# Patient Record
Sex: Male | Born: 1985 | Race: White | Hispanic: No | Marital: Married | Smoking: Never smoker
Health system: Southern US, Community
[De-identification: ages and names within clinical notes are randomized; demographics above are authoritative.]

## PROBLEM LIST (undated history)

## (undated) HISTORY — PX: TYMPANOSTOMY TUBE PLACEMENT: SHX32

## (undated) HISTORY — PX: MENISCUS DEBRIDEMENT: SHX5178

---
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
## 2011-03-10 DIAGNOSIS — J309 Allergic rhinitis, unspecified: Secondary | ICD-10-CM | POA: Insufficient documentation

## 2013-09-27 DIAGNOSIS — M545 Low back pain, unspecified: Secondary | ICD-10-CM | POA: Insufficient documentation

## 2014-03-14 DIAGNOSIS — M25461 Effusion, right knee: Secondary | ICD-10-CM | POA: Insufficient documentation

## 2017-06-26 ENCOUNTER — Ambulatory Visit: Payer: 59 | Admitting: Family Medicine

## 2017-06-26 ENCOUNTER — Encounter: Payer: Self-pay | Admitting: Family Medicine

## 2017-06-26 DIAGNOSIS — E669 Obesity, unspecified: Secondary | ICD-10-CM | POA: Insufficient documentation

## 2017-06-26 DIAGNOSIS — R21 Rash and other nonspecific skin eruption: Secondary | ICD-10-CM | POA: Diagnosis not present

## 2017-06-26 DIAGNOSIS — Z6841 Body Mass Index (BMI) 40.0 and over, adult: Secondary | ICD-10-CM

## 2017-06-26 MED ORDER — TRIAMCINOLONE ACETONIDE 0.5 % EX CREA: 1.0000 "application " | TOPICAL_CREAM | Freq: Two times a day (BID) | CUTANEOUS | 2 refills | Status: DC

## 2017-06-26 NOTE — Assessment & Plan Note (Signed)
Appears to be eczema, differential includes stasis dermatitis Has had good response to triamcinolone in the past, rx for this sent to pharmacy. He will contact me if not improving.

## 2017-06-26 NOTE — Progress Notes (Signed)
Patient ID: Russell Dorsey, male   DOB: 02/20/1985, 32 y.o.   MRN: [id number]  Russell Dorsey - 32 y.o. male MRN [id number]  Date of birth: Sep 19, 1985  Subjective Chief Complaint  Patient presents with  . Rash    HPI Russell Dorsey is a 32 y.o. male known to me from previous practice, here today with complaint of rash to posterior L leg.  Has had this in the past.  Rash is itchy in nature and tends to worsen in the summer.  Has used triamcinolone 0.5% cream in the past with good results.  He denies any pain associated with this, fever, chills, swelling or drainage.    -Obesity:  Has tried several diets in the past with mixed results.  Reports that weight often yo-yo's and that he doesn't really have trouble losing weight but does have trouble keeping it off.  Feels that weight is affecting libido and possibly concentration  He has never tried any medication for treatment however has considered having weight loss surgery.    ROS: ROS completed and negative except as noted per HPI No Known Allergies  History reviewed. No pertinent past medical history.  Past Surgical History:  Procedure Laterality Date  . MENISCUS DEBRIDEMENT    . TYMPANOSTOMY TUBE PLACEMENT      Social History   Socioeconomic History  . Marital status: Single    Spouse name: Not on file  . Number of children: Not on file  . Years of education: Not on file  . Highest education level: Not on file  Occupational History  . Not on file  Social Needs  . Financial resource strain: Not on file  . Food insecurity:    Worry: Not on file    Inability: Not on file  . Transportation needs:    Medical: Not on file    Non-medical: Not on file  Tobacco Use  . Smoking status: Never Smoker  . Smokeless tobacco: Never Used  Substance and Sexual Activity  . Alcohol use: Yes    Comment: socially  . Drug use: Never  . Sexual activity: Not on file  Lifestyle  . Physical activity:    Days per week: Not on file    Minutes  per session: Not on file  . Stress: Not on file  Relationships  . Social connections:    Talks on phone: Not on file    Gets together: Not on file    Attends religious service: Not on file    Active member of club or organization: Not on file    Attends meetings of clubs or organizations: Not on file    Relationship status: Not on file  Other Topics Concern  . Not on file  Social History Narrative  . Not on file    Family History  Problem Relation Age of Onset  . Hypertension Brother   . Diabetes Maternal Grandfather   . Heart disease Maternal Grandfather   . Diabetes Paternal Grandfather     Health Maintenance  Topic Date Due  . HIV Screening  07/29/2001  . TETANUS/TDAP  07/29/2005  . INFLUENZA VACCINE  08/30/2017    ----------------------------------------------------------------------------------------------------------------------------------------------------------------------------------------------------------------- Physical Exam BP 140/78   Pulse 88   Ht  (1.88 m)   Wt (!) 372 lb 9.6 oz (169 kg)   BMI 47.84 kg/m   Physical Exam  Constitutional: He is oriented to person, place, and time. No distress.  HENT:  Head: Normocephalic and atraumatic.  Mouth/Throat: Oropharynx is clear and  moist.  Neck: Neck supple. No thyromegaly present.  Cardiovascular: Normal rate, regular rhythm and normal heart sounds.  Pulmonary/Chest: Effort normal and breath sounds normal.  Neurological: He is alert and oriented to person, place, and time.  Skin:  Scaly, erythematous rash to posterior calf.    Psychiatric: He has a normal mood and affect. His behavior is normal.    ------------------------------------------------------------------------------------------------------------------------------------------------------------------------------------------------------------------- Assessment and Plan  Rash Appears to be eczema, differential includes stasis  dermatitis Has had good response to triamcinolone in the past, rx for this sent to pharmacy. He will contact me if not improving.   Obesity ~15 min spent counseling patient regarding obesity Discussed options for weight loss including dietary/activity changes, medications and surgical options Would recommend starting with dietary activity changes which he plans to try again, he will consider weight loss medications.   Will discuss whether he would like to try medication further at next visit for CPE.

## 2017-06-26 NOTE — Assessment & Plan Note (Signed)
~  15 min spent counseling patient regarding obesity Discussed options for weight loss including dietary/activity changes, medications and surgical options Would recommend starting with dietary activity changes which he plans to try again, he will consider weight loss medications.   Will discuss whether he would like to try medication further at next visit for CPE.

## 2017-06-26 NOTE — Patient Instructions (Signed)
It was good to see you! Schedule an appt for a physical with fasting labs.

## 2017-07-16 ENCOUNTER — Encounter (HOSPITAL_BASED_OUTPATIENT_CLINIC_OR_DEPARTMENT_OTHER): Payer: Self-pay | Admitting: *Deleted

## 2017-07-16 ENCOUNTER — Other Ambulatory Visit: Payer: Self-pay

## 2017-07-16 ENCOUNTER — Encounter (HOSPITAL_BASED_OUTPATIENT_CLINIC_OR_DEPARTMENT_OTHER): Payer: Self-pay | Admitting: Emergency Medicine

## 2017-07-16 ENCOUNTER — Emergency Department (HOSPITAL_BASED_OUTPATIENT_CLINIC_OR_DEPARTMENT_OTHER)
Admission: EM | Admit: 2017-07-16 | Discharge: 2017-07-17 | Disposition: A | Discharge status: Home / Self Care | Payer: 59 | Source: Home / Self Care | Attending: Emergency Medicine | Admitting: Emergency Medicine

## 2017-07-16 ENCOUNTER — Emergency Department (HOSPITAL_BASED_OUTPATIENT_CLINIC_OR_DEPARTMENT_OTHER)
Admission: EM | Admit: 2017-07-16 | Discharge: 2017-07-16 | Disposition: A | Discharge status: Home / Self Care | Payer: 59 | Attending: Emergency Medicine | Admitting: Emergency Medicine

## 2017-07-16 DIAGNOSIS — L03116 Cellulitis of left lower limb: Secondary | ICD-10-CM | POA: Insufficient documentation

## 2017-07-16 DIAGNOSIS — L03119 Cellulitis of unspecified part of limb: Secondary | ICD-10-CM

## 2017-07-16 DIAGNOSIS — R21 Rash and other nonspecific skin eruption: Secondary | ICD-10-CM

## 2017-07-16 DIAGNOSIS — L02416 Cutaneous abscess of left lower limb: Secondary | ICD-10-CM

## 2017-07-16 MED ORDER — CEPHALEXIN 250 MG PO CAPS
1000.0000 mg | ORAL_CAPSULE | Freq: Once | ORAL | Status: AC
  Administered 2017-07-16: 1000 mg via ORAL
  Filled 2017-07-16: qty 4

## 2017-07-16 MED ORDER — CEPHALEXIN 500 MG PO CAPS: 500.0000 mg | ORAL_CAPSULE | Freq: Four times a day (QID) | ORAL | 0 refills | Status: DC

## 2017-07-16 NOTE — ED Triage Notes (Signed)
Patient has redness noted to left lower extremity; patient co severe itching; using topical rx cream to area; states some relief with cream noted.

## 2017-07-16 NOTE — ED Triage Notes (Signed)
Cellulitis to his left lower leg. He took his Keflex as directed and today the redness has spread. He was told he may need IV antibiotics if that happened.

## 2017-07-16 NOTE — ED Provider Notes (Signed)
[facility] Provider Note: Russell DellJ. Lane Dyson Sevey, MD, FACEP  CSN: [id number] MRN: [id number] ARRIVAL: 07/16/17 at 0322 ROOM: [facility]10/[facility]10   CHIEF COMPLAINT  Rash   HISTORY OF PRESENT ILLNESS  07/16/17 4:13 AM Russell Dorsey is a 32 y.o. male who has had a maculopapular, confluent rash to the back of his left leg for about a year.  He has been treating this with triamcinolone cream.  The rash is itchy and he has been scratching it recently.  For the past 4 days he has developed erythema, pain and warmth inferior to the original rash.  There is moderate pain associated with the new skin changes.  He has not had a fever.  Pain is worse with movement, ambulation or palpation.   History reviewed. No pertinent past medical history.  Past Surgical History:  Procedure Laterality Date  . MENISCUS DEBRIDEMENT    . TYMPANOSTOMY TUBE PLACEMENT      Family History  Problem Relation Age of Onset  . Hypertension Brother   . Diabetes Maternal Grandfather   . Heart disease Maternal Grandfather   . Diabetes Paternal Grandfather     Social History   Tobacco Use  . Smoking status: Never Smoker  . Smokeless tobacco: Never Used  Substance Use Topics  . Alcohol use: Yes    Comment: socially  . Drug use: Never    Prior to Admission medications   Medication Sig Start Date End Date Taking? Authorizing Provider  triamcinolone cream (KENALOG) 0.5 % Apply 1 application topically 2 (two) times daily. 06/26/17   Everrett CoombeMatthews, Cody, DO    Allergies Patient has no known allergies.   REVIEW OF SYSTEMS  Negative except as noted here or in the History of Present Illness.   PHYSICAL EXAMINATION  Initial Vital Signs Blood pressure (!) 158/87, pulse 91, temperature 98.3 F (36.8 C), temperature source Oral, resp. rate 20, height 6\' 2"  (1.88 m), weight (!) 168.7 kg (372 lb), SpO2 99 %.  Examination General: Well-developed, well-nourished male in no acute distress; appearance consistent with age  of record HENT: normocephalic; atraumatic Eyes: Normal appearance Neck: supple Heart: regular rate and rhythm Lungs: clear to auscultation bilaterally Abdomen: soft; nondistended; nontender; bowel sounds present Extremities: No deformity; full range of motion Neurologic: Awake, alert and oriented; motor function intact in all extremities and symmetric; no facial droop Skin: Warm and dry; dark, maculopapular, confluent rash to posterior left leg with surrounding erythema and warmth primarily inferior to the rash:    Psychiatric: Normal mood and affect   RESULTS  Summary of this visit's results, reviewed by myself:   EKG Interpretation  Date/Time:    Ventricular Rate:    PR Interval:    QRS Duration:   QT Interval:    QTC Calculation:   R Axis:     Text Interpretation:        Laboratory Studies: No results found for this or any previous visit (from the past 24 hour(s)). Imaging Studies: No results found.  ED COURSE and MDM  Nursing notes and initial vitals signs, including pulse oximetry, reviewed.  Vitals:   07/16/17 0336 07/16/17 0338  BP: (!) 158/87   Pulse: 91   Resp: 20   Temp: 98.3 F (36.8 C)   TempSrc: Oral   SpO2: 99%   Weight:  (!) 168.7 kg (372 lb)  Height:  6\' 2"  (1.88 m)   The patient's original rash is nonspecific.  The new changes are consistent with cellulitis likely secondary to scratching.  We will place him on Keflex.  No fluctuance or pointing abscess is seen or palpated.  PROCEDURES    ED DIAGNOSES     ICD-10-CM   1. Cellulitis and abscess of left leg L03.116    L02.416   2. Rash R21        Cormac Wint, Jonny Ruiz, MD 07/16/17 [phone]

## 2017-07-17 MED ORDER — CEFTRIAXONE SODIUM 1 G IJ SOLR
1.0000 g | Freq: Once | INTRAMUSCULAR | Status: AC
  Administered 2017-07-17: 1 g via INTRAVENOUS
  Filled 2017-07-17: qty 10

## 2017-07-17 NOTE — Discharge Instructions (Signed)
Continue Keflex as previously prescribed.  Return to the ER for worsening redness, worsening pain, high fevers, or other new and concerning symptoms.

## 2017-07-17 NOTE — ED Provider Notes (Signed)
[facility] EMERGENCY DEPARTMENT Provider Note   CSN: [id number] Arrival date & time: 07/16/17  2129     History   Chief Complaint Chief Complaint  Patient presents with  . Cellulitis    HPI Russell Kelchndrew Strand is a 32 y.o. male.  Patient is a 32 year old male with no significant past medical history.  He presents today with complaints of left leg redness, pain, and swelling.  He was diagnosed less than 24 hours ago with cellulitis.  He was started on Keflex.  He returns tonight because the redness is extending beyond the line that was drawn here yesterday evening.  He denies any fevers or chills.  He has had 4 doses of Keflex today.  The history is provided by the patient.    History reviewed. No pertinent past medical history.  Patient Active Problem List   Diagnosis Date Noted  . Rash 06/26/2017  . Obesity 06/26/2017    Past Surgical History:  Procedure Laterality Date  . MENISCUS DEBRIDEMENT    . TYMPANOSTOMY TUBE PLACEMENT          Home Medications    Prior to Admission medications   Medication Sig Start Date End Date Taking? Authorizing Provider  cephALEXin (KEFLEX) 500 MG capsule Take 1 capsule (500 mg total) by mouth 4 (four) times daily. 07/16/17   Molpus, John, MD  triamcinolone cream (KENALOG) 0.5 % Apply 1 application topically 2 (two) times daily. 06/26/17   Everrett CoombeMatthews, Cody, DO    Family History Family History  Problem Relation Age of Onset  . Hypertension Brother   . Diabetes Maternal Grandfather   . Heart disease Maternal Grandfather   . Diabetes Paternal Grandfather     Social History Social History   Tobacco Use  . Smoking status: Never Smoker  . Smokeless tobacco: Never Used  Substance Use Topics  . Alcohol use: Yes    Comment: socially  . Drug use: Never     Allergies   Patient has no known allergies.   Review of Systems Review of Systems  All other systems reviewed and are negative.    Physical Exam Updated Vital  Signs BP 130/73 (BP Location: Right Arm)   Pulse 66   Temp 98.3 F (36.8 C) (Oral)   Resp 16   Ht 6\' 2"  (1.88 m)   Wt (!) 168.7 kg (372 lb)   SpO2 97%   BMI 47.76 kg/m   Physical Exam  Constitutional: He is oriented to person, place, and time. He appears well-developed and well-nourished. No distress.  HENT:  Head: Normocephalic and atraumatic.  Mouth/Throat: Oropharynx is clear and moist.  Neck: Normal range of motion. Neck supple.  Cardiovascular: Normal rate and regular rhythm. Exam reveals no friction rub.  No murmur heard. Pulmonary/Chest: Effort normal and breath sounds normal. No respiratory distress. He has no wheezes. He has no rales.  Musculoskeletal: Normal range of motion. He exhibits no edema.  Neurological: He is alert and oriented to person, place, and time. Coordination normal.  Skin: Skin is warm and dry. He is not diaphoretic.  There is an area of erythema to the posterior aspect of the left calf.  It is warm to the touch.  The area is non-circumferential.  Distal PMS is intact.  Nursing note and vitals reviewed.    ED Treatments / Results  Labs (all labs ordered are listed, but only abnormal results are displayed) Labs Reviewed - No data to display  EKG None  Radiology No results found.  Procedures Procedures (including critical care time)  Medications Ordered in ED Medications  cefTRIAXone (ROCEPHIN) 1 g in sodium chloride 0.9 % 100 mL IVPB (has no administration in time range)     Initial Impression / Assessment and Plan / ED Course  I have reviewed the triage vital signs and the nursing notes.  Pertinent labs & imaging results that were available during my care of the patient were reviewed by me and considered in my medical decision making (see chart for details).  Patient with leg cellulitis extending beyond the marks made less than 24 hours ago.  He was given an additional injection of ceftriaxone this evening and he is to continue Keflex  as previously prescribed.  If this continues to worsen, he should return to the ER to be rechecked.  Final Clinical Impressions(s) / ED Diagnoses   Final diagnoses:  None    ED Discharge Orders    None       Geoffery Lyons, MD 07/17/17 [phone]

## 2017-07-18 ENCOUNTER — Telehealth: Payer: Self-pay

## 2017-07-18 NOTE — Telephone Encounter (Signed)
Patient received Keflex from ED on 07/16/17.    Copied from CRM [phone]. Topic: Quick Communication - See Telephone Encounter >> Jul 18, 2017  1:08 PM Gean BirchwoodWilliams-Neal, Sade R wrote: CRM for notification. See Telephone encounter for: 07/18/17. Pt wants to know reasons why he isnt able to take other medicines while he is on antibiotic cephALEXin (KEFLEX) 500 MG capsule. Callback #[phone]

## 2017-07-20 NOTE — Telephone Encounter (Signed)
Spoke with pt and he clarified that is was OTC meds. I told him that it is ok to take those with abx. TLG

## 2017-08-08 ENCOUNTER — Other Ambulatory Visit: Payer: Self-pay | Admitting: Family Medicine

## 2017-08-08 MED ORDER — METHOCARBAMOL 750 MG PO TABS: 750.0000 mg | ORAL_TABLET | Freq: Three times a day (TID) | ORAL | 1 refills | Status: DC | PRN

## 2017-09-27 MED FILL — CEPHALEXIN 500 MG CAPSULE: 500 | 10 days supply | Qty: 40 | Fill #0

## 2017-09-27 MED FILL — SULFAMETHOXAZOLE-TMP DS TAB: 800-160 | 10 days supply | Qty: 20 | Fill #0

## 2018-01-02 ENCOUNTER — Ambulatory Visit: Payer: BLUE CROSS/BLUE SHIELD | Admitting: Family Medicine

## 2018-01-02 ENCOUNTER — Encounter: Payer: Self-pay | Admitting: Family Medicine

## 2018-01-02 DIAGNOSIS — J40 Bronchitis, not specified as acute or chronic: Secondary | ICD-10-CM

## 2018-01-02 MED ORDER — DOXYCYCLINE HYCLATE 100 MG PO TABS: 100.0000 mg | ORAL_TABLET | Freq: Two times a day (BID) | ORAL | 0 refills | Status: DC

## 2018-01-02 NOTE — Patient Instructions (Signed)
Start doxycyline Try humidifier at home Stay well hydrated Let me know if not improving.

## 2018-01-02 NOTE — Assessment & Plan Note (Signed)
-  Start doxycycline for prolonged symptoms.  -Declines albuterol inhaler -Recommend humidifier in home/bedroom -Increase fluid intake -Call if not improving.

## 2018-01-02 NOTE — Progress Notes (Signed)
Russell Dorsey Adelstein - 32 y.o. male MRN [id number]  Date of birth: Jan 06, 1986  Subjective Chief Complaint  Patient presents with  . Sore Throat    HPI Russell Dorsey Payer is a 32 y.o. male here today with complaint of cough, congestion, and sore throat.  He reports that symptoms started about 2-3 weeks ago.  Has completed a course of amoxicillin without improvement.  He has been using OTC medications that provide temporary relief.  He does feel shortness of breath at times with some intermittent wheezing.  He denies  chest pain.  He has been afebrile.  He denies sinus pain, nausea, vomiting, diarrhea.   ROS:  A comprehensive ROS was completed and negative except as noted per HPI  No Known Allergies  No past medical history on file.  Past Surgical History:  Procedure Laterality Date  . MENISCUS DEBRIDEMENT    . TYMPANOSTOMY TUBE PLACEMENT      Social History   Socioeconomic History  . Marital status: Married    Spouse name: Not on file  . Number of children: Not on file  . Years of education: Not on file  . Highest education level: Not on file  Occupational History  . Not on file  Social Needs  . Financial resource strain: Not on file  . Food insecurity:    Worry: Not on file    Inability: Not on file  . Transportation needs:    Medical: Not on file    Non-medical: Not on file  Tobacco Use  . Smoking status: Never Smoker  . Smokeless tobacco: Never Used  Substance and Sexual Activity  . Alcohol use: Yes    Comment: socially  . Drug use: Never  . Sexual activity: Yes  Lifestyle  . Physical activity:    Days per week: Not on file    Minutes per session: Not on file  . Stress: Not on file  Relationships  . Social connections:    Talks on phone: Not on file    Gets together: Not on file    Attends religious service: Not on file    Active member of club or organization: Not on file    Attends meetings of clubs or organizations: Not on file    Relationship status: Not on  file  Other Topics Concern  . Not on file  Social History Narrative  . Not on file    Family History  Problem Relation Age of Onset  . Hypertension Brother   . Diabetes Maternal Grandfather   . Heart disease Maternal Grandfather   . Diabetes Paternal Grandfather     Health Maintenance  Topic Date Due  . HIV Screening  07/29/2001  . TETANUS/TDAP  07/29/2005  . INFLUENZA VACCINE  08/30/2017    ----------------------------------------------------------------------------------------------------------------------------------------------------------------------------------------------------------------- Physical Exam BP 130/70   Pulse 87   Temp 97.6 F (36.4 C)   Wt (!) 394 lb (178.7 kg)   SpO2 97%   BMI 50.59 kg/m   Physical Exam  Constitutional: He is oriented to person, place, and time. He appears well-nourished. No distress.  HENT:  Head: Normocephalic and atraumatic.  Mouth/Throat: Oropharynx is clear and moist.  Neck: Neck supple. No thyromegaly present.  Cardiovascular: Normal rate, regular rhythm and normal heart sounds.  Pulmonary/Chest: Effort normal and breath sounds normal.  Lymphadenopathy:    He has no cervical adenopathy.  Neurological: He is alert and oriented to person, place, and time.  Skin: Skin is warm and dry.  Psychiatric: He has a normal  mood and affect. His behavior is normal.    ------------------------------------------------------------------------------------------------------------------------------------------------------------------------------------------------------------------- Assessment and Plan  Bronchitis -Start doxycycline for prolonged symptoms.  -Declines albuterol inhaler -Recommend humidifier in home/bedroom -Increase fluid intake -Call if not improving.

## 2018-01-03 ENCOUNTER — Encounter: Payer: Self-pay | Admitting: Family Medicine

## 2018-01-03 ENCOUNTER — Other Ambulatory Visit: Payer: Self-pay | Admitting: Family Medicine

## 2018-01-03 MED ORDER — BETAMETHASONE DIPROPIONATE 0.05 % EX CREA: TOPICAL_CREAM | Freq: Two times a day (BID) | CUTANEOUS | 1 refills | Status: DC

## 2018-07-31 ENCOUNTER — Encounter: Payer: Self-pay | Admitting: Family Medicine

## 2018-10-27 ENCOUNTER — Encounter: Payer: Self-pay | Admitting: Family Medicine

## 2018-10-27 ENCOUNTER — Other Ambulatory Visit: Payer: Self-pay | Admitting: Family Medicine

## 2018-10-27 MED ORDER — CEPHALEXIN 500 MG PO CAPS: 500.0000 mg | ORAL_CAPSULE | Freq: Two times a day (BID) | ORAL | 0 refills | Status: AC

## 2019-09-09 ENCOUNTER — Encounter: Payer: Self-pay | Admitting: Nurse Practitioner

## 2019-09-09 ENCOUNTER — Other Ambulatory Visit: Payer: Self-pay

## 2019-09-09 ENCOUNTER — Ambulatory Visit: Payer: PRIVATE HEALTH INSURANCE | Admitting: Nurse Practitioner

## 2019-09-09 VITALS — BP 128/88 | HR 81 | Temp 97.8°F | Ht 74.0 in | Wt >= 6400 oz

## 2019-09-09 DIAGNOSIS — H66002 Acute suppurative otitis media without spontaneous rupture of ear drum, left ear: Secondary | ICD-10-CM

## 2019-09-09 MED ORDER — AMOXICILLIN-POT CLAVULANATE 875-125 MG PO TABS: 1.0000 | ORAL_TABLET | Freq: Two times a day (BID) | ORAL | 0 refills | Status: DC

## 2019-09-09 MED ORDER — CETIRIZINE-PSEUDOEPHEDRINE ER 5-120 MG PO TB12: 1.0000 | ORAL_TABLET | Freq: Two times a day (BID) | ORAL | 0 refills | Status: DC

## 2019-09-09 MED ORDER — IBUPROFEN 600 MG PO TABS: 600.0000 mg | ORAL_TABLET | Freq: Three times a day (TID) | ORAL | 0 refills | Status: DC | PRN

## 2019-09-09 NOTE — Progress Notes (Signed)
Subjective:  Patient ID: Russell Dorsey, male    DOB: 10/31/1985  Age: 34 y.o. MRN: [id number]  CC: Ear Pain (left ear for last week//pt said he tried peroxide and oTC ear drops with no relief//pt said today ear stopped up an can't hear much out of that ear) and Nasal Congestion (pt mentioned hes had a chest cold for week an half but tested negative a week ago-last Tuesday for Covid//asking if he could get a Z pack or something to knock it out an thinks the drainage may have caused the ear)  Otalgia  There is pain in the left ear. This is a new problem. The current episode started in the past 7 days. The problem occurs constantly. The problem has been gradually worsening. Associated symptoms include headaches, hearing loss and rhinorrhea. Pertinent negatives include no ear discharge, neck pain, rash, sore throat or vomiting. He has tried acetaminophen for the symptoms. The treatment provided mild relief. There is no history of a chronic ear infection or hearing loss.   Reviewed past Medical, Social and Family history today.  Outpatient Medications Prior to Visit  Medication Sig Dispense Refill  . doxycycline (VIBRA-TABS) 100 MG tablet Take 1 tablet (100 mg total) by mouth 2 (two) times daily. 20 tablet 0  . betamethasone dipropionate (DIPROLENE) 0.05 % cream Apply topically 2 (two) times daily. (Patient not taking: Reported on 09/09/2019) 45 g 1  . fluticasone (FLONASE) 50 MCG/ACT nasal spray SHAKE LQ AND U 1 SPR IEN QD (Patient not taking: Reported on 09/09/2019)  0  . methocarbamol (ROBAXIN-750) 750 MG tablet Take 1 tablet (750 mg total) by mouth every 8 (eight) hours as needed for muscle spasms. (Patient not taking: Reported on 09/09/2019) 90 tablet 1   No facility-administered medications prior to visit.   ROS See HPI  Objective:  BP 128/88   Pulse 81   Temp 97.8 F (36.6 C) (Tympanic)   Ht 6\' 2"  (1.88 m)   Wt (!) 402 lb 9.6 oz (182.6 kg)   SpO2 98%   BMI 51.69 kg/m   Physical  Exam Constitutional:      Appearance: [name].  HENT:     Head:     Jaw: There is normal jaw occlusion.     Right Ear: Tympanic membrane, ear canal and external ear normal.     Left Ear: Decreased hearing noted. Tenderness present. No drainage. A middle ear effusion is present. There is no impacted cerumen. No mastoid tenderness. Tympanic membrane is injected, erythematous and bulging. Tympanic membrane is not perforated.  Cardiovascular:     Rate and Rhythm: Normal rate.     Pulses: Normal pulses.     Heart sounds: Normal heart sounds.  Pulmonary:     Effort: Pulmonary effort is normal.     Breath sounds: Normal breath sounds.  Lymphadenopathy:     Cervical: No cervical adenopathy.  Skin:    General: Skin is warm and dry.  Neurological:     Mental Status: He is alert and oriented to person, place, and time.    Assessment & Plan:  This visit occurred during the SARS-CoV-2 public health emergency.  Safety protocols were in place, including screening questions prior to the visit, additional usage of staff PPE, and extensive cleaning of exam room while observing appropriate contact time as indicated for disinfecting solutions.   Russell Dorsey was seen today for ear pain and nasal congestion.  Diagnoses and all orders for this visit:  Non-recurrent acute suppurative  otitis media of left ear without spontaneous rupture of tympanic membrane -     amoxicillin-clavulanate (AUGMENTIN) 875-125 MG tablet; Take 1 tablet by mouth 2 (two) times daily for 7 days. -     cetirizine-pseudoephedrine (ZYRTEC-D ALLERGY & CONGESTION) 5-120 MG tablet; Take 1 tablet by mouth 2 (two) times daily. -     ibuprofen (ADVIL) 600 MG tablet; Take 1 tablet (600 mg total) by mouth every 8 (eight) hours as needed (with food).    Problem List Items Addressed This Visit    None    Visit Diagnoses    Non-recurrent acute suppurative otitis media of left ear without spontaneous rupture of tympanic membrane    -   Primary   Relevant Medications   amoxicillin-clavulanate (AUGMENTIN) 875-125 MG tablet   cetirizine-pseudoephedrine (ZYRTEC-D ALLERGY & CONGESTION) 5-120 MG tablet   ibuprofen (ADVIL) 600 MG tablet      Follow-up: No follow-ups on file.  Alysia Penna, NP

## 2019-09-09 NOTE — Patient Instructions (Signed)
Otitis Media, Adult  Otitis media means that the middle ear is red and swollen (inflamed) and full of fluid. The condition usually goes away on its own. Follow these instructions at home:  Take over-the-counter and prescription medicines only as told by your doctor.  If you were prescribed an antibiotic medicine, take it as told by your doctor. Do not stop taking the antibiotic even if you start to feel better.  Keep all follow-up visits as told by your doctor. This is important. Contact a doctor if:  You have bleeding from your nose.  There is a lump on your neck.  You are not getting better in 5 days.  You feel worse instead of better. Get help right away if:  You have pain that is not helped with medicine.  You have swelling, redness, or pain around your ear.  You get a stiff neck.  You cannot move part of your face (paralyzed).  You notice that the bone behind your ear hurts when you touch it.  You get a very bad headache. Summary  Otitis media means that the middle ear is red, swollen, and full of fluid.  This condition usually goes away on its own. In some cases, treatment may be needed.  If you were prescribed an antibiotic medicine, take it as told by your doctor. This information is not intended to replace advice given to you by your health care provider. Make sure you discuss any questions you have with your health care provider. Document Revised: 12/29/2016 Document Reviewed: 02/07/2016 Elsevier Patient Education  2020 [employer]  

## 2019-09-15 ENCOUNTER — Encounter: Payer: Self-pay | Admitting: Nurse Practitioner

## 2019-09-15 DIAGNOSIS — H66002 Acute suppurative otitis media without spontaneous rupture of ear drum, left ear: Secondary | ICD-10-CM

## 2019-09-15 MED ORDER — AMOXICILLIN-POT CLAVULANATE 875-125 MG PO TABS: 1.0000 | ORAL_TABLET | Freq: Two times a day (BID) | ORAL | 0 refills | Status: AC

## 2019-09-15 MED ORDER — METHYLPREDNISOLONE 4 MG PO TBPK: ORAL_TABLET | ORAL | 0 refills | Status: DC

## 2019-09-16 ENCOUNTER — Other Ambulatory Visit: Payer: Self-pay

## 2019-09-16 ENCOUNTER — Encounter (HOSPITAL_BASED_OUTPATIENT_CLINIC_OR_DEPARTMENT_OTHER): Payer: Self-pay | Admitting: *Deleted

## 2019-09-16 ENCOUNTER — Emergency Department (HOSPITAL_BASED_OUTPATIENT_CLINIC_OR_DEPARTMENT_OTHER)
Admission: EM | Admit: 2019-09-16 | Discharge: 2019-09-16 | Disposition: A | Discharge status: Home / Self Care | Payer: PRIVATE HEALTH INSURANCE | Attending: Emergency Medicine | Admitting: Emergency Medicine

## 2019-09-16 DIAGNOSIS — H60392 Other infective otitis externa, left ear: Secondary | ICD-10-CM | POA: Insufficient documentation

## 2019-09-16 DIAGNOSIS — H9202 Otalgia, left ear: Secondary | ICD-10-CM | POA: Diagnosis present

## 2019-09-16 MED ORDER — KETOROLAC TROMETHAMINE 60 MG/2ML IM SOLN
30.0000 mg | Freq: Once | INTRAMUSCULAR | Status: AC
  Administered 2019-09-16: 30 mg via INTRAMUSCULAR
  Filled 2019-09-16: qty 2

## 2019-09-16 MED ORDER — OFLOXACIN 0.3 % OP SOLN
5.0000 [drp] | Freq: Two times a day (BID) | OPHTHALMIC | Status: DC
  Administered 2019-09-16: 5 [drp] via OTIC
  Filled 2019-09-16: qty 5

## 2019-09-16 NOTE — ED Provider Notes (Signed)
[facility] EMERGENCY DEPARTMENT Provider Note  CSN: [id number] Arrival date & time: 09/16/19 0014  Chief Complaint(s) Otalgia  HPI Russell Dorsey is a 34 y.o. male   The history is provided by the patient.  Otalgia Location:  Left Quality:  Throbbing Severity:  Severe Onset quality:  Gradual Duration:  1 week Timing:  Constant Progression:  Worsening Chronicity:  New Context comment:  Diagnosed with AOM and Rx Amox. completed 7 day course yesterday. pain worsened tonight Relieved by:  Nothing Worsened by:  Position Associated symptoms: ear discharge and hearing loss   Associated symptoms: no fever, no neck pain, no rhinorrhea, no sore throat and no vomiting     Past Medical History History reviewed. No pertinent past medical history. Patient Active Problem List   Diagnosis Date Noted  . Bronchitis 01/02/2018  . Rash 06/26/2017  . Obesity 06/26/2017   Home Medication(s) Prior to Admission medications   Medication Sig Start Date End Date Taking? Authorizing Provider  amoxicillin-clavulanate (AUGMENTIN) 875-125 MG tablet Take 1 tablet by mouth 2 (two) times daily for 3 days. To complete 10days 09/15/19 09/18/19  Nche, Bonna Gains, NP  ibuprofen (ADVIL) 600 MG tablet Take 1 tablet (600 mg total) by mouth every 8 (eight) hours as needed (with food). 09/09/19   Nche, Bonna Gains, NP  methylPREDNISolone (MEDROL DOSEPAK) 4 MG TBPK tablet Take as directed on package 09/15/19   Nche, Bonna Gains, NP                                                                                                                                    Past Surgical History Past Surgical History:  Procedure Laterality Date  . MENISCUS DEBRIDEMENT    . TYMPANOSTOMY TUBE PLACEMENT     Family History Family History  Problem Relation Age of Onset  . Hypertension Brother   . Diabetes Maternal Grandfather   . Heart disease Maternal Grandfather   . Diabetes Paternal Grandfather      Social History Social History   Tobacco Use  . Smoking status: Never Smoker  . Smokeless tobacco: Never Used  Substance Use Topics  . Alcohol use: Yes    Comment: socially  . Drug use: Never   Allergies Patient has no known allergies.  Review of Systems Review of Systems  Constitutional: Negative for fever.  HENT: Positive for ear discharge, ear pain and hearing loss. Negative for rhinorrhea and sore throat.   Gastrointestinal: Negative for vomiting.  Musculoskeletal: Negative for neck pain.   All other systems are reviewed and are negative for acute change except as noted in the HPI  Physical Exam Vital Signs  I have reviewed the triage vital signs BP (!) 155/98 (BP Location: Left Arm)   Pulse 77   Temp 98.1 F (36.7 C) (Oral)   Resp 18   Ht 6\' 3"  (1.905 m)   Wt (!) 182.3 kg   SpO2 97%  BMI 50.25 kg/m   Physical Exam Vitals reviewed.  Constitutional:      General: He is not in acute distress.    Appearance: He is well-developed. [name]. He is not diaphoretic.  HENT:     Head: Normocephalic and atraumatic.     Jaw: No trismus.     Right Ear: External ear normal. There is impacted cerumen.     Left Ear: Decreased hearing noted. Drainage, swelling and tenderness present. No mastoid tenderness. Tympanic membrane is injected and bulging.     Nose: Nose normal.  Eyes:     General: No scleral icterus.    Conjunctiva/sclera: Conjunctivae normal.  Neck:     Trachea: Phonation normal.  Cardiovascular:     Rate and Rhythm: Normal rate and regular rhythm.  Pulmonary:     Effort: Pulmonary effort is normal. No respiratory distress.     Breath sounds: No stridor.  Abdominal:     General: There is no distension.  Musculoskeletal:        General: Normal range of motion.     Cervical back: Normal range of motion.  Neurological:     Mental Status: He is alert and oriented to person, place, and time.  Psychiatric:        Behavior: Behavior normal.      ED Results and Treatments Labs (all labs ordered are listed, but only abnormal results are displayed) Labs Reviewed - No data to display                                                                                                                       EKG  EKG Interpretation  Date/Time:    Ventricular Rate:    PR Interval:    QRS Duration:   QT Interval:    QTC Calculation:   R Axis:     Text Interpretation:        Radiology No results found.  Pertinent labs & imaging results that were available during my care of the patient were reviewed by me and considered in my medical decision making (see chart for details).  Medications Ordered in ED Medications  ketorolac (TORADOL) injection 30 mg (has no administration in time range)  ofloxacin (OCUFLOX) 0.3 % ophthalmic solution 5 drop (has no administration in time range)  Procedures Procedures  (including critical care time)  Medical Decision Making / ED Course I have reviewed the nursing notes for this encounter and the patient's prior records (if available in EHR or on provided paperwork).   Russell Dorsey was evaluated in Emergency Department on 09/16/2019 for the symptoms described in the history of present illness. He was evaluated in the context of the global COVID-19 pandemic, which necessitated consideration that the patient might be at risk for infection with the SARS-CoV-2 virus that causes COVID-19. Institutional protocols and algorithms that pertain to the evaluation of patients at risk for COVID-19 are in a state of rapid change based on information released by regulatory bodies including the CDC and federal and state organizations. These policies and algorithms were followed during the patient's care in the ED.  Patient now has evidence of otitis externa. Due to swelling complete  visualization of TM is difficult, but what is visible appears to be intact. Provided with IM toradol for pain. Ofloxacin drops.       Final Clinical Impression(s) / ED Diagnoses Final diagnoses:  Other infective acute otitis externa of left ear    The patient appears reasonably screened and/or stabilized for discharge and I doubt any other medical condition or other [facility] requiring further screening, evaluation, or treatment in the ED at this time prior to discharge. Safe for discharge with strict return precautions.  Disposition: Discharge  Condition: Good  I have discussed the results, Dx and Tx plan with the patient/family who expressed understanding and agree(s) with the plan. Discharge instructions discussed at length. The patient/family was given strict return precautions who verbalized understanding of the instructions. No further questions at time of discharge.    ED Discharge Orders    None        Follow Up: Everrett Coombe, DO [address] [facility] [address] [place] [address] [phone]  Schedule an appointment as soon as possible for a visit  in 3-5 days, If symptoms do not improve or  worsen     This chart was dictated using voice recognition software.  Despite best efforts to proofread,  errors can occur which can change the documentation meaning.   Nira Conn, MD 09/16/19 [phone]

## 2019-09-16 NOTE — ED Triage Notes (Signed)
Pr c/o left ear pain x 1 week , DX ear infection x 6 days ago completed amoxil w/o improvement

## 2019-11-04 ENCOUNTER — Ambulatory Visit (INDEPENDENT_AMBULATORY_CARE_PROVIDER_SITE_OTHER): Payer: PRIVATE HEALTH INSURANCE | Admitting: Family Medicine

## 2019-11-04 ENCOUNTER — Ambulatory Visit (INDEPENDENT_AMBULATORY_CARE_PROVIDER_SITE_OTHER): Payer: PRIVATE HEALTH INSURANCE

## 2019-11-04 ENCOUNTER — Other Ambulatory Visit: Payer: Self-pay

## 2019-11-04 ENCOUNTER — Encounter: Payer: Self-pay | Admitting: Family Medicine

## 2019-11-04 VITALS — BP 145/83 | HR 75 | Temp 98.2°F | Wt >= 6400 oz

## 2019-11-04 DIAGNOSIS — M25561 Pain in right knee: Secondary | ICD-10-CM

## 2019-11-04 DIAGNOSIS — Z6841 Body Mass Index (BMI) 40.0 and over, adult: Secondary | ICD-10-CM

## 2019-11-04 DIAGNOSIS — R030 Elevated blood-pressure reading, without diagnosis of hypertension: Secondary | ICD-10-CM | POA: Insufficient documentation

## 2019-11-04 DIAGNOSIS — G8929 Other chronic pain: Secondary | ICD-10-CM

## 2019-11-04 DIAGNOSIS — M25461 Effusion, right knee: Secondary | ICD-10-CM | POA: Diagnosis not present

## 2019-11-04 DIAGNOSIS — M545 Low back pain, unspecified: Secondary | ICD-10-CM | POA: Diagnosis not present

## 2019-11-04 DIAGNOSIS — H66002 Acute suppurative otitis media without spontaneous rupture of ear drum, left ear: Secondary | ICD-10-CM | POA: Diagnosis not present

## 2019-11-04 MED ORDER — SEMAGLUTIDE-WEIGHT MANAGEMENT 1.7 MG/0.75ML ~~LOC~~ SOAJ: 1.7000 mg | SUBCUTANEOUS | 0 refills | Status: DC

## 2019-11-04 MED ORDER — SEMAGLUTIDE-WEIGHT MANAGEMENT 0.25 MG/0.5ML ~~LOC~~ SOAJ: 0.2500 mg | SUBCUTANEOUS | 0 refills | Status: AC

## 2019-11-04 MED ORDER — METHOCARBAMOL 750 MG PO TABS: 750.0000 mg | ORAL_TABLET | Freq: Three times a day (TID) | ORAL | 1 refills | Status: AC | PRN

## 2019-11-04 MED ORDER — SEMAGLUTIDE-WEIGHT MANAGEMENT 0.5 MG/0.5ML ~~LOC~~ SOAJ: 0.5000 mg | SUBCUTANEOUS | 0 refills | Status: DC

## 2019-11-04 MED ORDER — SEMAGLUTIDE-WEIGHT MANAGEMENT 2.4 MG/0.75ML ~~LOC~~ SOAJ: 2.4000 mg | SUBCUTANEOUS | 2 refills | Status: DC

## 2019-11-04 MED ORDER — SEMAGLUTIDE-WEIGHT MANAGEMENT 1 MG/0.5ML ~~LOC~~ SOAJ: 1.0000 mg | SUBCUTANEOUS | 0 refills | Status: DC

## 2019-11-04 MED ORDER — IBUPROFEN 600 MG PO TABS: 600.0000 mg | ORAL_TABLET | Freq: Three times a day (TID) | ORAL | 1 refills | Status: AC | PRN

## 2019-11-04 NOTE — Assessment & Plan Note (Signed)
Weight loss discussed.  Discussed limiting portions and binging.  Medications to assist with weight loss discussed.  Will start Wegovy with titration over the next few months.   Follow up in about 8 weeks.

## 2019-11-04 NOTE — Assessment & Plan Note (Signed)
BP elevated today.  Discussed recommendations for weight loss and low sodium intake.   He will check readings at home as well.

## 2019-11-04 NOTE — Assessment & Plan Note (Signed)
Will obtain xrays of R knee.  He can use ibuprofen as needed.

## 2019-11-04 NOTE — Progress Notes (Signed)
Russell Dorsey - 34 y.o. male MRN [id number]  Date of birth: July 27, 1985  Subjective Chief Complaint  Patient presents with  . Cyst  . Back Pain    HPI Russell Dorsey is a 34 y.o. male here today with complaint of knee and back pain.    Back pain is chronic with intermittent flares.  He has been able to manage well with ibuprofen and robaxin as needed.  Pain is non-radicular.  Also having R knee pain.  Prior arthroscopy of R knee due to meniscal tear.  Has had some swelling around the kneecap.  Denies locking or popping sensation of the knee.  No recent injury or overuse.     Unfortunately his weight continues to increase. He states that he has been stress eating due to work related stress and his family going to a single income due to his wife leaving her job due to not receiving COVID vaccine.  He states he pretty much eats everything he can get his hands on.    ROS:  A comprehensive ROS was completed and negative except as noted per HPI  No Known Allergies  History reviewed. No pertinent past medical history.  Past Surgical History:  Procedure Laterality Date  . MENISCUS DEBRIDEMENT    . TYMPANOSTOMY TUBE PLACEMENT      Social History   Socioeconomic History  . Marital status: Married    Spouse name: Not on file  . Number of children: Not on file  . Years of education: Not on file  . Highest education level: Not on file  Occupational History  . Not on file  Tobacco Use  . Smoking status: Never Smoker  . Smokeless tobacco: Never Used  Substance and Sexual Activity  . Alcohol use: Yes    Comment: socially  . Drug use: Never  . Sexual activity: Yes  Other Topics Concern  . Not on file  Social History Narrative  . Not on file   Social Determinants of Health   Financial Resource Strain:   . Difficulty of Paying Living Expenses: Not on file  Food Insecurity:   . Worried About Programme researcher, broadcasting/film/video in the Last Year: Not on file  . Ran Out of Food in the Last  Year: Not on file  Transportation Needs:   . Lack of Transportation (Medical): Not on file  . Lack of Transportation (Non-Medical): Not on file  Physical Activity:   . Days of Exercise per Week: Not on file  . Minutes of Exercise per Session: Not on file  Stress:   . Feeling of Stress : Not on file  Social Connections:   . Frequency of Communication with Friends and Family: Not on file  . Frequency of Social Gatherings with Friends and Family: Not on file  . Attends Religious Services: Not on file  . Active Member of Clubs or Organizations: Not on file  . Attends Banker Meetings: Not on file  . Marital Status: Not on file    Family History  Problem Relation Age of Onset  . Hypertension Brother   . Diabetes Maternal Grandfather   . Heart disease Maternal Grandfather   . Diabetes Paternal Grandfather     Health Maintenance  Topic Date Due  . Hepatitis C Screening  Never done  . HIV Screening  Never done  . TETANUS/TDAP  01/31/2020 (Originally 07/29/2005)  . COVID-19 Vaccine (1) 01/31/2020 (Originally 07/30/1998)  . INFLUENZA VACCINE  04/29/2020 (Originally 08/31/2019)     -----------------------------------------------------------------------------------------------------------------------------------------------------------------------------------------------------------------  Physical Exam BP (!) 145/83 (BP Location: Left Wrist, Patient Position: Sitting, Cuff Size: Large)   Pulse 75   Temp 98.2 F (36.8 C)   Wt (!) 418 lb (189.6 kg)   SpO2 99%   BMI 52.25 kg/m   Physical Exam Constitutional:      Appearance: Normal appearance.  HENT:     Head: Normocephalic and atraumatic.  Eyes:     General: No scleral icterus. Cardiovascular:     Rate and Rhythm: Normal rate and regular rhythm.  Pulmonary:     Effort: Pulmonary effort is normal.     Breath sounds: Normal breath sounds.  Musculoskeletal:     Cervical back: Neck supple.     Comments: R knee with  mild ttp along patella.  There is mild swelling adjacent to patella as well.  ROM is normal.    Neurological:     General: No focal deficit present.     Mental Status: He is alert.  Psychiatric:        Mood and Affect: Mood normal.        Behavior: Behavior normal.     ------------------------------------------------------------------------------------------------------------------------------------------------------------------------------------------------------------------- Assessment and Plan  Swelling of right knee joint Will obtain xrays of R knee.  He can use ibuprofen as needed.   Low back pain Recurrent back pain.  Robaxin and ibuprofen are helpful for exacerbations.  Discussed core strengthening and weight loss to help with recurrent episodes.   Elevated BP without diagnosis of hypertension BP elevated today.  Discussed recommendations for weight loss and low sodium intake.   He will check readings at home as well.   Obesity Weight loss discussed.  Discussed limiting portions and binging.  Medications to assist with weight loss discussed.  Will start Wegovy with titration over the next few months.   Follow up in about 8 weeks.    Meds ordered this encounter  Medications  . Semaglutide-Weight Management 0.25 MG/0.5ML SOAJ    Sig: Inject 0.25 mg into the skin once a week for 28 days.    Dispense:  2 mL    Refill:  0  . Semaglutide-Weight Management 0.5 MG/0.5ML SOAJ    Sig: Inject 0.5 mg into the skin once a week for 28 days.    Dispense:  2 mL    Refill:  0  . Semaglutide-Weight Management 1 MG/0.5ML SOAJ    Sig: Inject 1 mg into the skin once a week for 28 days.    Dispense:  2 mL    Refill:  0  . Semaglutide-Weight Management 1.7 MG/0.75ML SOAJ    Sig: Inject 1.7 mg into the skin once a week for 28 days.    Dispense:  3 mL    Refill:  0  . Semaglutide-Weight Management 2.4 MG/0.75ML SOAJ    Sig: Inject 2.4 mg into the skin once a week for 28 days.     Dispense:  3 mL    Refill:  2  . methocarbamol (ROBAXIN-750) 750 MG tablet    Sig: Take 1 tablet (750 mg total) by mouth every 8 (eight) hours as needed for muscle spasms.    Dispense:  90 tablet    Refill:  1  . ibuprofen (ADVIL) 600 MG tablet    Sig: Take 1 tablet (600 mg total) by mouth every 8 (eight) hours as needed (with food).    Dispense:  30 tablet    Refill:  1    Return in about 8 weeks (around 12/30/2019) for weight  management. .    This visit occurred during the SARS-CoV-2 public health emergency.  Safety protocols were in place, including screening questions prior to the visit, additional usage of staff PPE, and extensive cleaning of exam room while observing appropriate contact time as indicated for disinfecting solutions.

## 2019-11-04 NOTE — Assessment & Plan Note (Signed)
Recurrent back pain.  Robaxin and ibuprofen are helpful for exacerbations.  Discussed core strengthening and weight loss to help with recurrent episodes.

## 2019-11-04 NOTE — Patient Instructions (Signed)
Wegovy savings card- https://www.[employer].com/wegovy/savings-card.html Have xray completed.  See me in about 8 weeks after starting wegovy.

## 2019-12-30 ENCOUNTER — Ambulatory Visit: Payer: PRIVATE HEALTH INSURANCE | Admitting: Family Medicine

## 2019-12-31 ENCOUNTER — Other Ambulatory Visit: Payer: Self-pay

## 2019-12-31 ENCOUNTER — Ambulatory Visit (INDEPENDENT_AMBULATORY_CARE_PROVIDER_SITE_OTHER): Payer: PRIVATE HEALTH INSURANCE | Admitting: Family Medicine

## 2019-12-31 ENCOUNTER — Encounter: Payer: Self-pay | Admitting: Family Medicine

## 2019-12-31 VITALS — BP 150/86 | HR 100 | Temp 97.8°F | Wt >= 6400 oz

## 2019-12-31 DIAGNOSIS — Z20822 Contact with and (suspected) exposure to covid-19: Secondary | ICD-10-CM

## 2019-12-31 DIAGNOSIS — M25461 Effusion, right knee: Secondary | ICD-10-CM | POA: Diagnosis not present

## 2019-12-31 DIAGNOSIS — Z6841 Body Mass Index (BMI) 40.0 and over, adult: Secondary | ICD-10-CM

## 2019-12-31 DIAGNOSIS — J069 Acute upper respiratory infection, unspecified: Secondary | ICD-10-CM | POA: Diagnosis not present

## 2019-12-31 DIAGNOSIS — R030 Elevated blood-pressure reading, without diagnosis of hypertension: Secondary | ICD-10-CM

## 2019-12-31 MED ORDER — SEMAGLUTIDE-WEIGHT MANAGEMENT 1.7 MG/0.75ML ~~LOC~~ SOAJ: 1.7000 mg | SUBCUTANEOUS | 0 refills | Status: DC

## 2019-12-31 MED ORDER — SEMAGLUTIDE-WEIGHT MANAGEMENT 1 MG/0.5ML ~~LOC~~ SOAJ: 1.0000 mg | SUBCUTANEOUS | 0 refills | Status: DC

## 2019-12-31 MED ORDER — SEMAGLUTIDE-WEIGHT MANAGEMENT 0.5 MG/0.5ML ~~LOC~~ SOAJ: 0.5000 mg | SUBCUTANEOUS | 0 refills | Status: AC

## 2019-12-31 MED ORDER — SEMAGLUTIDE-WEIGHT MANAGEMENT 2.4 MG/0.75ML ~~LOC~~ SOAJ: 2.4000 mg | SUBCUTANEOUS | 0 refills | Status: DC

## 2019-12-31 MED ORDER — SEMAGLUTIDE-WEIGHT MANAGEMENT 0.25 MG/0.5ML ~~LOC~~ SOAJ: 0.2500 mg | SUBCUTANEOUS | 0 refills | Status: AC

## 2019-12-31 NOTE — Assessment & Plan Note (Signed)
Recommend supportive care with increased fluids and rest.  COVID testing ordered today as he has felt a little feverish today.  Recommend quarantine until results return.

## 2019-12-31 NOTE — Assessment & Plan Note (Signed)
Continues to have recurrent swelling of knee.  Recommend trying voltaren gel, if this continues will consider getting MRI

## 2019-12-31 NOTE — Assessment & Plan Note (Addendum)
Provided with coupon for for 6 month supply of Wegovy.  We'll plan to follow up in about 8-10 weeks.  Continue to work on dietary changes.

## 2019-12-31 NOTE — Assessment & Plan Note (Signed)
BP remains elevated today.  If remains elevated at next f/u we may need to initiate medication to get his under better control.

## 2019-12-31 NOTE — Progress Notes (Signed)
Russell Dorsey - 34 y.o. male MRN [id number]  Date of birth: Oct 18, 1985  Subjective Chief Complaint  Patient presents with  . Nasal Congestion    HPI Russell Dorsey is a 34 y.o. male here today for follow up of weight management. He has not started [facility] yet due to cost.  He has been working on dietary and lifestyle change and weight is down about 4 lbs since last visit.    He has had cold recently with congestion and cough.  Also mild shortness of breath with exertion.  States he felt feverish today.  Temp has been normal.  He has not had body aches, headache, nausea, vomiting, diarrhea.  He has not had COVID vaccine.    He also has had recurrent issues with R knee.  Previous meniscal debridement but has had problems with pain and swelling that comes and goes.  No locking or unstable sensation.  ROS:  A comprehensive ROS was completed and negative except as noted per HPI  No Known Allergies  History reviewed. No pertinent past medical history.  Past Surgical History:  Procedure Laterality Date  . MENISCUS DEBRIDEMENT    . TYMPANOSTOMY TUBE PLACEMENT      Social History   Socioeconomic History  . Marital status: Married    Spouse name: Not on file  . Number of children: Not on file  . Years of education: Not on file  . Highest education level: Not on file  Occupational History  . Not on file  Tobacco Use  . Smoking status: Never Smoker  . Smokeless tobacco: Never Used  Substance and Sexual Activity  . Alcohol use: Yes    Comment: socially  . Drug use: Never  . Sexual activity: Yes  Other Topics Concern  . Not on file  Social History Narrative  . Not on file   Social Determinants of Health   Financial Resource Strain:   . Difficulty of Paying Living Expenses: Not on file  Food Insecurity:   . Worried About Programme researcher, broadcasting/film/video in the Last Year: Not on file  . Ran Out of Food in the Last Year: Not on file  Transportation Needs:   . Lack of Transportation  (Medical): Not on file  . Lack of Transportation (Non-Medical): Not on file  Physical Activity:   . Days of Exercise per Week: Not on file  . Minutes of Exercise per Session: Not on file  Stress:   . Feeling of Stress : Not on file  Social Connections:   . Frequency of Communication with Friends and Family: Not on file  . Frequency of Social Gatherings with Friends and Family: Not on file  . Attends Religious Services: Not on file  . Active Member of Clubs or Organizations: Not on file  . Attends Banker Meetings: Not on file  . Marital Status: Not on file    Family History  Problem Relation Age of Onset  . Hypertension Brother   . Diabetes Maternal Grandfather   . Heart disease Maternal Grandfather   . Diabetes Paternal Grandfather     Health Maintenance  Topic Date Due  . Hepatitis C Screening  Never done  . HIV Screening  Never done  . TETANUS/TDAP  01/31/2020 (Originally 07/29/2005)  . COVID-19 Vaccine (1) 01/31/2020 (Originally 07/30/1998)  . INFLUENZA VACCINE  04/29/2020 (Originally 08/31/2019)     ----------------------------------------------------------------------------------------------------------------------------------------------------------------------------------------------------------------- Physical Exam BP (!) 150/86 (BP Location: Left Arm, Patient Position: Sitting, Cuff Size: Large)   Pulse 100  Temp 97.8 F (36.6 C) (Oral)   Wt (!) 414 lb 1.9 oz (187.8 kg)   SpO2 96%   BMI 51.76 kg/m   Physical Exam Constitutional:      Appearance: Normal appearance.  HENT:     Head: Normocephalic and atraumatic.  Eyes:     General: No scleral icterus. Cardiovascular:     Rate and Rhythm: Normal rate and regular rhythm.  Pulmonary:     Effort: Pulmonary effort is normal.     Breath sounds: Normal breath sounds.  Musculoskeletal:     Cervical back: Neck supple.  Neurological:     General: No focal deficit present.     Mental Status: He is  alert.  Psychiatric:        Mood and Affect: Mood normal.        Behavior: Behavior normal.     ------------------------------------------------------------------------------------------------------------------------------------------------------------------------------------------------------------------- Assessment and Plan  Swelling of right knee joint Continues to have recurrent swelling of knee.  Recommend trying voltaren gel, if this continues will consider getting MRI  Obesity Provided with coupon for for 6 month supply of Wegovy.  We'll plan to follow up in about 8-10 weeks.  Continue to work on dietary changes.    URI (upper respiratory infection) Recommend supportive care with increased fluids and rest.  COVID testing ordered today as he has felt a little feverish today.  Recommend quarantine until results return.    Elevated BP without diagnosis of hypertension BP remains elevated today.  If remains elevated at next f/u we may need to initiate medication to get his under better control.     Meds ordered this encounter  Medications  . Semaglutide-Weight Management 0.25 MG/0.5ML SOAJ    Sig: Inject 0.25 mg into the skin once a week for 28 days.    Dispense:  2 mL    Refill:  0  . Semaglutide-Weight Management 0.5 MG/0.5ML SOAJ    Sig: Inject 0.5 mg into the skin once a week for 28 days.    Dispense:  2 mL    Refill:  0  . Semaglutide-Weight Management 1 MG/0.5ML SOAJ    Sig: Inject 1 mg into the skin once a week for 28 days.    Dispense:  2 mL    Refill:  0  . Semaglutide-Weight Management 1.7 MG/0.75ML SOAJ    Sig: Inject 1.7 mg into the skin once a week for 28 days.    Dispense:  3 mL    Refill:  0  . Semaglutide-Weight Management 2.4 MG/0.75ML SOAJ    Sig: Inject 2.4 mg into the skin once a week for 28 days.    Dispense:  3 mL    Refill:  0    No follow-ups on file.    This visit occurred during the SARS-CoV-2 public health emergency.  Safety  protocols were in place, including screening questions prior to the visit, additional usage of staff PPE, and extensive cleaning of exam room while observing appropriate contact time as indicated for disinfecting solutions.

## 2020-01-02 ENCOUNTER — Encounter: Payer: Self-pay | Admitting: Family Medicine

## 2020-01-02 ENCOUNTER — Other Ambulatory Visit: Payer: Self-pay | Admitting: Family Medicine

## 2020-01-02 LAB — SARS-COV-2, NAA 2 DAY TAT

## 2020-01-02 LAB — NOVEL CORONAVIRUS, NAA: SARS-CoV-2, NAA: NOT DETECTED

## 2020-01-02 MED ORDER — AZITHROMYCIN 250 MG PO TABS: ORAL_TABLET | ORAL | 0 refills | Status: DC

## 2020-01-19 ENCOUNTER — Encounter: Payer: Self-pay | Admitting: Family Medicine

## 2020-02-09 ENCOUNTER — Encounter: Payer: Self-pay | Admitting: Family Medicine

## 2020-02-09 ENCOUNTER — Ambulatory Visit (INDEPENDENT_AMBULATORY_CARE_PROVIDER_SITE_OTHER): Payer: PRIVATE HEALTH INSURANCE | Admitting: Family Medicine

## 2020-02-09 ENCOUNTER — Other Ambulatory Visit: Payer: Self-pay

## 2020-02-09 VITALS — BP 176/91 | HR 90 | Ht 74.0 in | Wt >= 6400 oz

## 2020-02-09 DIAGNOSIS — Z6841 Body Mass Index (BMI) 40.0 and over, adult: Secondary | ICD-10-CM

## 2020-02-09 DIAGNOSIS — Z202 Contact with and (suspected) exposure to infections with a predominantly sexual mode of transmission: Secondary | ICD-10-CM | POA: Insufficient documentation

## 2020-02-09 NOTE — Patient Instructions (Signed)
Chlamydia, Male °Chlamydia is an STI (sexually transmitted infection) that is caused by bacteria. This infection spreads through sexual contact. Chlamydia can occur in different areas of the body, including: °· The urethra. This is the part of the body that drains urine from the bladder and through the penis. °· The throat. °· The rectum. °This condition is not difficult to treat. However, if left untreated, chlamydia can lead to more serious health problems. °What are the causes? °This condition is caused by the bacteria Chlamydia trachomatis. The bacteria are spread from an infected partner during sexual activity. Chlamydia can spread through contact with the genitals, mouth, or rectum. °What increases the risk? °The following factors may make you more likely to develop this condition: °· Not using a condom correctly or not using a condom every time you have sex. °· Having a new sex partner or having more than one sex partner. °· Being a man who has sex with men (MSM). °What are the signs or symptoms? °In some cases, there are no symptoms, especially early in the infection. Having no symptoms is also called being asymptomatic. °If symptoms develop, they may include: °· Urinating often, or having a burning feeling during urination. °· Pain or swelling in the testicles. °· Watery, mucus-like discharge from the penis. °· Redness, soreness, swelling, bleeding, or discharge from the rectum. This may occur if the infection was spread through anal sex. °· Pain in the abdomen. °· Itching, burning, or redness in the eyes, or discharge from the eyes. °· Pain during sex. °How is this diagnosed? °This condition may be diagnosed with: °· Urine tests. °· Swab tests. Depending on your symptoms, your health care provider may use a cotton swab to collect discharge from your urethra or rectum to test for the bacteria. °How is this treated? °This condition is treated with antibiotic medicines. °Follow these instructions at  home: °Sexual activity °· Tell your sex partner or partners about your infection. These include any partners for oral, anal, or vaginal sex that you have had within 60 days of when your symptoms started. Sex partners should also be treated, even if they have no signs of the disease. °· Do not have sex until you and your sex partners have completed treatment and your health care provider says it is okay. If your health care provider prescribed you a single-dose medicine as treatment, wait at least 7 days after taking the medicine before having sex. °General instructions °· Take over-the-counter and prescription medicines as told by your health care provider. Finish all antibiotic medicine even when you start to feel better. °· It is up to you to get your test results. Ask your health care provider, or the department that is doing the test, when your results will be ready. °· Get plenty of rest. °· Eat a healthy, well-balanced diet. °· Drink enough fluids to keep your urine pale yellow. °· Keep all follow-up visits as told by your health care provider. This is important. You may need to be tested for infection again 3 months after treatment. °How is this prevented? °The only sure way to prevent chlamydia is to avoid having vaginal, anal, and oral sex. However, you can lower your risk by: °· Using latex condoms correctly every time you have sex. °· Not having multiple sexual partners. °· Asking if your sex partner has been tested for STIs and had negative results. °· Getting regular health screenings to check for STIs.   °Contact a health care provider if: °·   You develop new symptoms or your symptoms do not get better after you complete treatment. °· You have a fever or chills. °· You have pain during sex. °· You develop new joint pain or swelling near your joints. °· You have pain or soreness in your testicles. °Get help right away if: °· Your pain gets worse and does not get better with medicine. °· You have abnormal  discharge. °· You develop flu-like symptoms, such as night sweats, sore throat, or muscle aches. °Summary °· Chlamydia is an STI (sexually transmitted infection) that is caused by bacteria. This infection spreads through sexual contact. °· This condition is not difficult to treat. However, if left untreated, it can lead to more serious health problems. °· Some people have no symptoms (are asymptomatic), especially early in the infection. °· This condition is treated with antibiotic medicines. °· Using latex condoms correctly every time you have sex can help prevent chlamydia. °This information is not intended to replace advice given to you by your health care provider. Make sure you discuss any questions you have with your health care provider. °Document Revised: 02/03/2019 Document Reviewed: 02/03/2019 °Elsevier Patient Education © 2021 [employer] ° °

## 2020-02-09 NOTE — Progress Notes (Signed)
Russell Dorsey - 35 y.o. male MRN [id number]  Date of birth: 08-Jan-1986  Subjective Chief Complaint  Patient presents with  . Exposure to STD    HPI Russell Dorsey is a 35 y.o. male here today with complaint of STD exposure.  Reports that his wife recently tested positive for chlamydia on routine prenatal labs.  He reports that his wife has been his only partner.  He denies symptoms including discharge, dysuria, testicular pain, lymph node enlargement. Wife has been treated.  There are some issues at home now related to the origin of this.   ROS:  A comprehensive ROS was completed and negative except as noted per HPI  No Known Allergies  History reviewed. No pertinent past medical history.  Past Surgical History:  Procedure Laterality Date  . MENISCUS DEBRIDEMENT    . TYMPANOSTOMY TUBE PLACEMENT      Social History   Socioeconomic History  . Marital status: Married    Spouse name: Not on file  . Number of children: Not on file  . Years of education: Not on file  . Highest education level: Not on file  Occupational History  . Not on file  Tobacco Use  . Smoking status: Never Smoker  . Smokeless tobacco: Never Used  Substance and Sexual Activity  . Alcohol use: Yes    Comment: socially  . Drug use: Never  . Sexual activity: Yes  Other Topics Concern  . Not on file  Social History Narrative  . Not on file   Social Determinants of Health   Financial Resource Strain: Not on file  Food Insecurity: Not on file  Transportation Needs: Not on file  Physical Activity: Not on file  Stress: Not on file  Social Connections: Not on file    Family History  Problem Relation Age of Onset  . Hypertension Brother   . Diabetes Maternal Grandfather   . Heart disease Maternal Grandfather   . Diabetes Paternal Grandfather     Health Maintenance  Topic Date Due  . Hepatitis C Screening  Never done  . COVID-19 Vaccine (1) Never done  . HIV Screening  Never done  .  TETANUS/TDAP  Never done  . INFLUENZA VACCINE  04/29/2020 (Originally 08/31/2019)     ----------------------------------------------------------------------------------------------------------------------------------------------------------------------------------------------------------------- Physical Exam BP (!) 176/91 (BP Location: Left Arm, Patient Position: Sitting, Cuff Size: Large)   Pulse 90   Ht 6\' 2"  (1.88 m)   Wt (!) 424 lb 1.9 oz (192.4 kg)   SpO2 97%   BMI 54.45 kg/m   Physical Exam Constitutional:      Appearance: Normal appearance.  Eyes:     General: No scleral icterus. Cardiovascular:     Rate and Rhythm: Normal rate and regular rhythm.  Pulmonary:     Effort: Pulmonary effort is normal.     Breath sounds: Normal breath sounds.  Musculoskeletal:     Cervical back: Neck supple.  Neurological:     General: No focal deficit present.     Mental Status: He is alert.  Psychiatric:        Mood and Affect: Mood normal.        Behavior: Behavior normal.     ------------------------------------------------------------------------------------------------------------------------------------------------------------------------------------------------------------------- Assessment and Plan  Exposure to STD Discussed the only way that this can really be transferred is through sexual contact.  Check HIV, RPR, GC/Chlamydia today.  He should avoid intercourse with wife until testing returns.     Obesity He was unable to get [facility].  He  will continue to work on exercise and dietary changes.     No orders of the defined types were placed in this encounter.   No follow-ups on file.    This visit occurred during the SARS-CoV-2 public health emergency.  Safety protocols were in place, including screening questions prior to the visit, additional usage of staff PPE, and extensive cleaning of exam room while observing appropriate contact time as indicated for  disinfecting solutions.

## 2020-02-09 NOTE — Assessment & Plan Note (Signed)
He was unable to get [facility] filled.  He will continue to work on exercise and dietary changes.

## 2020-02-09 NOTE — Assessment & Plan Note (Addendum)
Discussed the only way that this can really be transferred is through sexual contact.  Check HIV, RPR, GC/Chlamydia today.  He should avoid intercourse with wife until testing returns.

## 2020-02-11 LAB — CHLAMYDIA/NEISSERIA GONORRHOEAE RNA,TMA,UROGENTIAL
C. trachomatis RNA, TMA: NOT DETECTED
N. gonorrhoeae RNA, TMA: NOT DETECTED

## 2020-02-11 LAB — HIV ANTIBODY (ROUTINE TESTING W REFLEX): HIV 1&2 Ab, 4th Generation: NONREACTIVE

## 2020-02-11 LAB — RPR: RPR Ser Ql: NONREACTIVE

## 2020-03-02 ENCOUNTER — Telehealth (INDEPENDENT_AMBULATORY_CARE_PROVIDER_SITE_OTHER): Payer: PRIVATE HEALTH INSURANCE | Admitting: Family Medicine

## 2020-03-02 ENCOUNTER — Encounter: Payer: Self-pay | Admitting: Family Medicine

## 2020-03-02 VITALS — Wt >= 6400 oz

## 2020-03-02 DIAGNOSIS — U071 COVID-19: Secondary | ICD-10-CM

## 2020-03-02 DIAGNOSIS — R0781 Pleurodynia: Secondary | ICD-10-CM | POA: Diagnosis not present

## 2020-03-02 DIAGNOSIS — R071 Chest pain on breathing: Secondary | ICD-10-CM

## 2020-03-02 NOTE — Assessment & Plan Note (Signed)
Post covid, now with pleuritic chest pain.  Discussed potential for PE vs PNA.  Stat CT angio of the chest ordered for evaluation of potential PE.  Will follow up with him once results return to discuss plan.  He has pulse ox coming to his house as well.  We discussed hospital evaluation if O2 sats consistently below 92%.

## 2020-03-02 NOTE — Progress Notes (Signed)
Russell Dorsey - 35 y.o. male MRN [id number]  Date of birth: September 21, 1985   This visit type was conducted due to national recommendations for restrictions regarding the COVID-19 Pandemic (e.g. social distancing).  This format is felt to be most appropriate for this patient at this time.  All issues noted in this document were discussed and addressed.  No physical exam was performed (except for noted visual exam findings with Video Visits).  I discussed the limitations of evaluation and management by telemedicine and the availability of in person appointments. The patient expressed understanding and agreed to proceed.  I connected with@ on 03/02/20 at  3:00 PM EST by a video enabled telemedicine application and verified that I am speaking with the correct person using two identifiers.  Present at visit: Everrett Coombe, DO [name]   Patient Location: Work [address] [place] [address]   Provider location:   [facility]  Chief Complaint  Patient presents with  . Follow-up    HPI  Russell Dorsey is a 35 y.o. male who presents via audio/video conferencing for a telehealth visit today.  He reports that he tested positive for COVID on 02/23/20 with onset of symptom 02/20/20.  He had fever and congestion with cough initially.  Fever broke a couple of days ago.  Started having sharp pain when taking a deep breath as well as feeling of sob, worse with exertion a couple of days ago.  He has not had palpitation or chest tightness.  He is unable to check pulse ox. He did not have COVID vaccine and did not receive any therapeutics for treatment.      ROS:  A comprehensive ROS was completed and negative except as noted per HPI  History reviewed. No pertinent past medical history.  Past Surgical History:  Procedure Laterality Date  . MENISCUS DEBRIDEMENT    . TYMPANOSTOMY TUBE PLACEMENT      Family History  Problem Relation Age of Onset  . Hypertension Brother   . Diabetes  Maternal Grandfather   . Heart disease Maternal Grandfather   . Diabetes Paternal Grandfather     Social History   Socioeconomic History  . Marital status: Married    Spouse name: Not on file  . Number of children: Not on file  . Years of education: Not on file  . Highest education level: Not on file  Occupational History  . Not on file  Tobacco Use  . Smoking status: Never Smoker  . Smokeless tobacco: Never Used  Substance and Sexual Activity  . Alcohol use: Yes    Comment: socially  . Drug use: Never  . Sexual activity: Yes  Other Topics Concern  . Not on file  Social History Narrative  . Not on file   Social Determinants of Health   Financial Resource Strain: Not on file  Food Insecurity: Not on file  Transportation Needs: Not on file  Physical Activity: Not on file  Stress: Not on file  Social Connections: Not on file  Intimate Partner Violence: Not on file     Current Outpatient Medications:  .  ibuprofen (ADVIL) 600 MG tablet, Take 1 tablet (600 mg total) by mouth every 8 (eight) hours as needed (with food)., Disp: 30 tablet, Rfl: 1 .  methocarbamol (ROBAXIN-750) 750 MG tablet, Take 1 tablet (750 mg total) by mouth every 8 (eight) hours as needed for muscle spasms., Disp: 90 tablet, Rfl: 1 .  Semaglutide-Weight Management 1 MG/0.5ML SOAJ, Inject 1 mg into the  skin once a week for 28 days. (Patient not taking: Reported on 03/02/2020), Disp: 2 mL, Rfl: 0 .  [START ON 03/27/2020] Semaglutide-Weight Management 1.7 MG/0.75ML SOAJ, Inject 1.7 mg into the skin once a week for 28 days. (Patient not taking: Reported on 03/02/2020), Disp: 3 mL, Rfl: 0 .  [START ON 04/25/2020] Semaglutide-Weight Management 2.4 MG/0.75ML SOAJ, Inject 2.4 mg into the skin once a week for 28 days. (Patient not taking: Reported on 03/02/2020), Disp: 3 mL, Rfl: 0  EXAM:  VITALS per patient if applicable: Wt (!) 424 lb (192.3 kg)   BMI 54.44 kg/m   GENERAL: alert, oriented, appears well and in no  acute distress  HEENT: atraumatic, conjunttiva clear, no obvious abnormalities on inspection of external nose and ears  NECK: normal movements of the head and neck  LUNGS: on inspection no signs of respiratory distress, breathing rate appears normal, no obvious gross SOB, gasping or wheezing  CV: no obvious cyanosis  MS: moves all visible extremities without noticeable abnormality  PSYCH/NEURO: pleasant and cooperative, no obvious depression or anxiety, speech and thought processing grossly intact  ASSESSMENT AND PLAN:  Discussed the following assessment and plan:  Pleuritic chest pain Post covid, now with pleuritic chest pain.  Discussed potential for PE vs PNA.  Stat CT angio of the chest ordered for evaluation of potential PE.  Will follow up with him once results return to discuss plan.  He has pulse ox coming to his house as well.  We discussed hospital evaluation if O2 sats consistently below 92%.       I discussed the assessment and treatment plan with the patient. The patient was provided an opportunity to ask questions and all were answered. The patient agreed with the plan and demonstrated an understanding of the instructions.   The patient was advised to call back or seek an in-person evaluation if the symptoms worsen or if the condition fails to improve as anticipated.    Everrett Coombe, DO

## 2020-03-02 NOTE — Progress Notes (Signed)
Congestion has moved to his chest.  It's painful to take deep breaths.  Completed quarantine.

## 2020-03-03 ENCOUNTER — Ambulatory Visit (HOSPITAL_BASED_OUTPATIENT_CLINIC_OR_DEPARTMENT_OTHER)
Admission: RE | Admit: 2020-03-03 | Discharge: 2020-03-03 | Disposition: A | Discharge status: Home / Self Care | Payer: PRIVATE HEALTH INSURANCE | Source: Ambulatory Visit | Attending: Family Medicine | Admitting: Family Medicine

## 2020-03-03 ENCOUNTER — Encounter (HOSPITAL_BASED_OUTPATIENT_CLINIC_OR_DEPARTMENT_OTHER): Payer: Self-pay

## 2020-03-03 ENCOUNTER — Other Ambulatory Visit: Payer: Self-pay

## 2020-03-03 ENCOUNTER — Encounter: Payer: Self-pay | Admitting: Family Medicine

## 2020-03-03 ENCOUNTER — Other Ambulatory Visit: Payer: Self-pay | Admitting: Family Medicine

## 2020-03-03 DIAGNOSIS — U071 COVID-19: Secondary | ICD-10-CM | POA: Insufficient documentation

## 2020-03-03 DIAGNOSIS — R071 Chest pain on breathing: Secondary | ICD-10-CM | POA: Insufficient documentation

## 2020-03-03 DIAGNOSIS — R0781 Pleurodynia: Secondary | ICD-10-CM | POA: Insufficient documentation

## 2020-03-03 MED ORDER — AZITHROMYCIN 250 MG PO TABS: ORAL_TABLET | ORAL | 0 refills | Status: DC

## 2020-03-03 MED ORDER — IOHEXOL 350 MG/ML SOLN
100.0000 mL | Freq: Once | INTRAVENOUS | Status: AC | PRN
  Administered 2020-03-03: 100 mL via INTRAVENOUS

## 2020-03-03 MED ORDER — PREDNISONE 20 MG PO TABS: 20.0000 mg | ORAL_TABLET | Freq: Two times a day (BID) | ORAL | 0 refills | Status: AC

## 2020-03-03 NOTE — Progress Notes (Signed)
zith

## 2020-06-07 ENCOUNTER — Telehealth: Payer: PRIVATE HEALTH INSURANCE | Admitting: Family Medicine

## 2020-06-07 ENCOUNTER — Encounter: Payer: Self-pay | Admitting: Family Medicine

## 2020-06-07 DIAGNOSIS — J329 Chronic sinusitis, unspecified: Secondary | ICD-10-CM

## 2020-06-07 DIAGNOSIS — J40 Bronchitis, not specified as acute or chronic: Secondary | ICD-10-CM

## 2020-06-07 MED ORDER — AMOXICILLIN-POT CLAVULANATE 875-125 MG PO TABS: 1.0000 | ORAL_TABLET | Freq: Two times a day (BID) | ORAL | 0 refills | Status: AC

## 2020-06-07 MED ORDER — PREDNISONE 20 MG PO TABS: 40.0000 mg | ORAL_TABLET | Freq: Every day | ORAL | 0 refills | Status: AC

## 2020-06-07 NOTE — Patient Instructions (Signed)
Antibiotic and steroids ordered. Please keep an eye on your BP, temperature, oxygen levels, heart rate. Continue Mucinex, Flonase, allergy medication, tylenol/ibuprofen, rest, hydration, warm compresses, humidifier use, salt water gargles, nasal spray (Flonase), etc. If you do not notice an improvement over the next few days, please schedule an in-person visit - you will need a negative COVID test prior to coming into the office with these symptoms. I hope you all feel better soon!

## 2020-06-07 NOTE — Progress Notes (Signed)
Virtual Video Visit via MyChart Note  I connected with  Raymie Giammarco on 06/07/20 at 11:10 AM EDT by the video enabled telemedicine application for MyChart, and verified that I am speaking with the correct person using two identifiers.   I introduced myself as a Designer, jewellery with the practice. We discussed the limitations of evaluation and management by telemedicine and the availability of in person appointments. The patient expressed understanding and agreed to proceed.  Participating parties in this visit include: The patient and the nurse practitioner listed.  The patient is: At work I am: In the office - [facility]  Subjective:    CC:  Chief Complaint  Patient presents with  . Sinusitis    HPI: Aleksander Edmiston is a 35 y.o. year old male presenting today via [place] today for sinusitis.  Patient states he has been having intermittent allergy symptoms all spring, but last Wednesday his symptoms significantly worsened and continued to develop. He states he is now miserable. He his having significant nasal congestion, shortness of breath and dyspnea with exertion, 7-9/10 frontal and maxillary sinus pressure, frequent cough with yellow/green sputum (more productive in the mornings), wheezing at night - has albuterol neb treatments. Reports he gets extremely winded just walking across the room to get to the bathroom. He has been having to sleep propped up in order to breathe better at night. Denies any fevers, chills, body aches, chest pain, pain with inspiration, nausea, vomiting, diarrhea, loss of taste/smell. He has been taking OTC cough/cold medications and analgesics with no improvement.   Reports his entire household got sick around the same time. Has not yet tested for COVID - just had it in January. Planning to take a home test tonight.    Past medical history, Surgical history, Family history not pertinant except as noted below, Social history, Allergies, and  medications have been entered into the medical record, reviewed, and corrections made.   Review of Systems:  All review of systems negative except what is listed in the HPI   Objective:    General:  Speaking clearly in complete sentences. Absent shortness of breath noted.   Alert and oriented x3.   Normal judgment.  Absent acute distress.   Impression and Recommendations:    1. Sinobronchitis Patient has not yet met typical duration for ABRS, however his symptoms are quite severe and he appears miserable. Given that he feels like this is starting to move into his chest and he is having significant dyspnea with exertion, I will go ahead and give him a burst of steroids and antibiotics. Encouraged him to keep an eye on his vital signs at home. Continue supportive measures including rest, hydration, humidifier, warm compresses, salt water gargles, nasal spray, Mucinex, allergy medications, etc. Recommend he get COVID tested so that he can be seen in office if symptoms are not improving after a few days of treatments. Educated on signs and symptoms requiring further evaluation.  - amoxicillin-clavulanate (AUGMENTIN) 875-125 MG tablet; Take 1 tablet by mouth 2 (two) times daily for 5 days.  Dispense: 10 tablet; Refill: 0 - predniSONE (DELTASONE) 20 MG tablet; Take 2 tablets (40 mg total) by mouth daily with breakfast for 5 days.  Dispense: 10 tablet; Refill: 0   Follow-up if symptoms worsen or fail to improve.    I discussed the assessment and treatment plan with the patient. The patient was provided an opportunity to ask questions and all were answered. The patient agreed with the plan and  demonstrated an understanding of the instructions.   The patient was advised to call back or seek an in-person evaluation if the symptoms worsen or if the condition fails to improve as anticipated.  I provided 20 minutes of non-face-to-face interaction with this [place] visit including intake, same-day  documentation, and chart review.   Terrilyn Saver, NP

## 2020-08-23 ENCOUNTER — Ambulatory Visit (INDEPENDENT_AMBULATORY_CARE_PROVIDER_SITE_OTHER): Payer: No Typology Code available for payment source | Admitting: Family Medicine

## 2020-08-23 ENCOUNTER — Other Ambulatory Visit: Payer: Self-pay

## 2020-08-23 VITALS — BP 172/74 | HR 86 | Temp 99.2°F

## 2020-08-23 DIAGNOSIS — Z23 Encounter for immunization: Secondary | ICD-10-CM

## 2020-08-23 NOTE — Progress Notes (Signed)
HPI:  Patient is here for Tdap vaccination. Screening Checklist for Contraindications completed by pt and reviewed.  No contraindications.  Verified with Dr. Ashley Royalty verbally that pt is okay to receive vaccine despite elevated blood pressure.  Advised pt that he will need f/u with Dr. Ashley Royalty re: HTN soon.  Pt expressed understanding and is agreeable.  A&P:  Patient tolerated injection well without complications.  Tiajuana Amass, CMA

## 2020-08-23 NOTE — Progress Notes (Signed)
Medical screening examination/treatment was performed by qualified clinical staff member and as supervising physician I was immediately available for consultation/collaboration. I have reviewed documentation and agree with assessment and plan.  Schylar Wuebker, DO  

## 2020-08-25 ENCOUNTER — Encounter: Payer: Self-pay | Admitting: Family Medicine

## 2020-08-25 ENCOUNTER — Other Ambulatory Visit: Payer: Self-pay

## 2020-08-25 ENCOUNTER — Ambulatory Visit: Payer: No Typology Code available for payment source | Admitting: Family Medicine

## 2020-08-25 VITALS — BP 156/83 | HR 68 | Temp 97.1°F | Ht 74.0 in | Wt >= 6400 oz

## 2020-08-25 DIAGNOSIS — I872 Venous insufficiency (chronic) (peripheral): Secondary | ICD-10-CM

## 2020-08-25 DIAGNOSIS — I10 Essential (primary) hypertension: Secondary | ICD-10-CM

## 2020-08-25 LAB — COMPLETE METABOLIC PANEL WITH GFR
AG Ratio: 1.5 (calc) (ref 1.0–2.5)
ALT: 73 U/L — ABNORMAL HIGH (ref 9–46)
AST: 42 U/L — ABNORMAL HIGH (ref 10–40)
Albumin: 4.6 g/dL (ref 3.6–5.1)
Alkaline phosphatase (APISO): 49 U/L (ref 36–130)
BUN: 16 mg/dL (ref 7–25)
CO2: 28 mmol/L (ref 20–32)
Calcium: 9.3 mg/dL (ref 8.6–10.3)
Chloride: 103 mmol/L (ref 98–110)
Creat: 1.01 mg/dL (ref 0.60–1.26)
Globulin: 3 g/dL (calc) (ref 1.9–3.7)
Glucose, Bld: 97 mg/dL (ref 65–99)
Potassium: 4.2 mmol/L (ref 3.5–5.3)
Sodium: 141 mmol/L (ref 135–146)
Total Bilirubin: 0.5 mg/dL (ref 0.2–1.2)
Total Protein: 7.6 g/dL (ref 6.1–8.1)
eGFR: 100 mL/min/{1.73_m2} (ref >=60)

## 2020-08-25 LAB — CBC WITH DIFFERENTIAL/PLATELET
Absolute Monocytes: 696 cells/uL (ref 200–950)
Basophils Absolute: 88 cells/uL (ref 0–200)
Basophils Relative: 1.1 %
Eosinophils Absolute: 120 cells/uL (ref 15–500)
Eosinophils Relative: 1.5 %
HCT: 46.8 % (ref 38.5–50.0)
Hemoglobin: 15.3 g/dL (ref 13.2–17.1)
Lymphs Abs: 3264 cells/uL (ref 850–3900)
MCH: 29.8 pg (ref 27.0–33.0)
MCHC: 32.7 g/dL (ref 32.0–36.0)
MCV: 91.1 fL (ref 80.0–100.0)
MPV: 10.7 fL (ref 7.5–12.5)
Monocytes Relative: 8.7 %
Neutro Abs: 3832 cells/uL (ref 1500–7800)
Neutrophils Relative %: 47.9 %
Platelets: 286 10*3/uL (ref 140–400)
RBC: 5.14 10*6/uL (ref 4.20–5.80)
RDW: 12.8 % (ref 11.0–15.0)
Total Lymphocyte: 40.8 %
WBC: 8 10*3/uL (ref 3.8–10.8)

## 2020-08-25 LAB — TSH: TSH: 2.97 mIU/L (ref 0.40–4.50)

## 2020-08-25 MED ORDER — LOSARTAN POTASSIUM 50 MG PO TABS: 50.0000 mg | ORAL_TABLET | Freq: Every day | ORAL | 1 refills | Status: DC

## 2020-08-25 NOTE — Assessment & Plan Note (Signed)
Recommend weight loss as well as reduction in sodium intake.  Compression stockings recommended

## 2020-08-25 NOTE — Assessment & Plan Note (Signed)
Blood pressure elevated on multiple occasions.  We discussed lifestyle change as well as weight loss to help with management of his hypertension.  We will start losartan 50 mg daily.  Checking baseline labs today.  I will have him follow-up in about 4 weeks to recheck renal function and potassium.

## 2020-08-25 NOTE — Patient Instructions (Signed)
https://www.nhlbi.nih.gov/files/docs/public/heart/dash_brief.pdf">  DASH Eating Plan DASH stands for Dietary Approaches to Stop Hypertension. The DASH eating plan is a healthy eating plan that has been shown to: Reduce high blood pressure (hypertension). Reduce your risk for type 2 diabetes, heart disease, and stroke. Help with weight loss. What are tips for following this plan? Reading food labels Check food labels for the amount of salt (sodium) per serving. Choose foods with less than 5 percent of the Daily Value of sodium. Generally, foods with less than 300 milligrams (mg) of sodium per serving fit into this eating plan. To find whole grains, look for the word "whole" as the first word in the ingredient list. Shopping Buy products labeled as "low-sodium" or "no salt added." Buy fresh foods. Avoid canned foods and pre-made or frozen meals. Cooking Avoid adding salt when cooking. Use salt-free seasonings or herbs instead of table salt or sea salt. Check with your health care provider or pharmacist before using salt substitutes. Do not fry foods. Cook foods using healthy methods such as baking, boiling, grilling, roasting, and broiling instead. Cook with heart-healthy oils, such as olive, canola, avocado, soybean, or sunflower oil. Meal planning  Eat a balanced diet that includes: 4 or more servings of fruits and 4 or more servings of vegetables each day. Try to fill one-half of your plate with fruits and vegetables. 6-8 servings of whole grains each day. Less than 6 oz (170 g) of lean meat, poultry, or fish each day. A 3-oz (85-g) serving of meat is about the same size as a deck of cards. One egg equals 1 oz (28 g). 2-3 servings of low-fat dairy each day. One serving is 1 cup (237 mL). 1 serving of nuts, seeds, or beans 5 times each week. 2-3 servings of heart-healthy fats. Healthy fats called omega-3 fatty acids are found in foods such as walnuts, flaxseeds, fortified milks, and eggs.  These fats are also found in cold-water fish, such as sardines, salmon, and mackerel. Limit how much you eat of: Canned or prepackaged foods. Food that is high in trans fat, such as some fried foods. Food that is high in saturated fat, such as fatty meat. Desserts and other sweets, sugary drinks, and other foods with added sugar. Full-fat dairy products. Do not salt foods before eating. Do not eat more than 4 egg yolks a week. Try to eat at least 2 vegetarian meals a week. Eat more home-cooked food and less restaurant, buffet, and fast food.  Lifestyle When eating at a restaurant, ask that your food be prepared with less salt or no salt, if possible. If you drink alcohol: Limit how much you use to: 0-1 drink a day for women who are not pregnant. 0-2 drinks a day for men. Be aware of how much alcohol is in your drink. In the [place] one drink equals one 12 oz bottle of beer (355 mL), one 5 oz glass of wine (148 mL), or one 1 oz glass of hard liquor (44 mL). General information Avoid eating more than 2,300 mg of salt a day. If you have hypertension, you may need to reduce your sodium intake to 1,500 mg a day. Work with your health care provider to maintain a healthy body weight or to lose weight. Ask what an ideal weight is for you. Get at least 30 minutes of exercise that causes your heart to beat faster (aerobic exercise) most days of the week. Activities may include walking, swimming, or biking. Work with your health care provider   or dietitian to adjust your eating plan to your individual calorie needs. What foods should I eat? Fruits All fresh, dried, or frozen fruit. Canned fruit in natural juice (without addedsugar). Vegetables Fresh or frozen vegetables (raw, steamed, roasted, or grilled). Low-sodium or reduced-sodium tomato and vegetable juice. Low-sodium or reduced-sodium tomatosauce and tomato paste. Low-sodium or reduced-sodium canned vegetables. Grains Whole-grain or  whole-wheat bread. Whole-grain or whole-wheat pasta. Brown rice. Oatmeal. Quinoa. Bulgur. Whole-grain and low-sodium cereals. Pita bread.Low-fat, low-sodium crackers. Whole-wheat flour tortillas. Meats and other proteins Skinless chicken or turkey. Ground chicken or turkey. Pork with fat trimmed off. Fish and seafood. Egg whites. Dried beans, peas, or lentils. Unsalted nuts, nut butters, and seeds. Unsalted canned beans. Lean cuts of beef with fat trimmed off. Low-sodium, lean precooked or cured meat, such as sausages or meatloaves. Dairy Low-fat (1%) or fat-free (skim) milk. Reduced-fat, low-fat, or fat-free cheeses. Nonfat, low-sodium ricotta or cottage cheese. Low-fat or nonfatyogurt. Low-fat, low-sodium cheese. Fats and oils Soft margarine without trans fats. Vegetable oil. Reduced-fat, low-fat, or light mayonnaise and salad dressings (reduced-sodium). Canola, safflower, olive, avocado, soybean, andsunflower oils. Avocado. Seasonings and condiments Herbs. Spices. Seasoning mixes without salt. Other foods Unsalted popcorn and pretzels. Fat-free sweets. The items listed above may not be a complete list of foods and beverages you can eat. Contact a dietitian for more information. What foods should I avoid? Fruits Canned fruit in a light or heavy syrup. Fried fruit. Fruit in cream or buttersauce. Vegetables Creamed or fried vegetables. Vegetables in a cheese sauce. Regular canned vegetables (not low-sodium or reduced-sodium). Regular canned tomato sauce and paste (not low-sodium or reduced-sodium). Regular tomato and vegetable juice(not low-sodium or reduced-sodium). Pickles. Olives. Grains Baked goods made with fat, such as croissants, muffins, or some breads. Drypasta or rice meal packs. Meats and other proteins Fatty cuts of meat. Ribs. Fried meat. Bacon. Bologna, salami, and other precooked or cured meats, such as sausages or meat loaves. Fat from the back of a pig (fatback). Bratwurst.  Salted nuts and seeds. Canned beans with added salt. Canned orsmoked fish. Whole eggs or egg yolks. Chicken or turkey with skin. Dairy Whole or 2% milk, cream, and half-and-half. Whole or full-fat cream cheese. Whole-fat or sweetened yogurt. Full-fat cheese. Nondairy creamers. Whippedtoppings. Processed cheese and cheese spreads. Fats and oils Butter. Stick margarine. Lard. Shortening. Ghee. Bacon fat. Tropical oils, suchas coconut, palm kernel, or palm oil. Seasonings and condiments Onion salt, garlic salt, seasoned salt, table salt, and sea salt. Worcestershire sauce. Tartar sauce. Barbecue sauce. Teriyaki sauce. Soy sauce, including reduced-sodium. Steak sauce. Canned and packaged gravies. Fish sauce. Oyster sauce. Cocktail sauce. Store-bought horseradish. Ketchup. Mustard. Meat flavorings and tenderizers. Bouillon cubes. Hot sauces. Pre-made or packaged marinades. Pre-made or packaged taco seasonings. Relishes. Regular saladdressings. Other foods Salted popcorn and pretzels. The items listed above may not be a complete list of foods and beverages you should avoid. Contact a dietitian for more information. Where to find more information National Heart, Lung, and Blood Institute: www.nhlbi.nih.gov American Heart Association: www.heart.org Academy of Nutrition and Dietetics: www.eatright.org National Kidney Foundation: www.kidney.org Summary The DASH eating plan is a healthy eating plan that has been shown to reduce high blood pressure (hypertension). It may also reduce your risk for type 2 diabetes, heart disease, and stroke. When on the DASH eating plan, aim to eat more fresh fruits and vegetables, whole grains, lean proteins, low-fat dairy, and heart-healthy fats. With the DASH eating plan, you should limit salt (sodium) intake to 2,300   mg a day. If you have hypertension, you may need to reduce your sodium intake to 1,500 mg a day. Work with your health care provider or dietitian to adjust  your eating plan to your individual calorie needs. This information is not intended to replace advice given to you by your health care provider. Make sure you discuss any questions you have with your healthcare provider. Document Revised: 12/20/2018 Document Reviewed: 12/20/2018 Elsevier Patient Education  2022 Elsevier Inc.  

## 2020-08-25 NOTE — Progress Notes (Signed)
Russell Dorsey - 35 y.o. male MRN [id number]  Date of birth: 1985-09-24  Subjective Chief Complaint  Patient presents with   Hypertension   Leg Swelling    Right lower leg, just above the ankle area, onset months ago, discolorization, is wondering if it is circulatory    HPI Russell Dorsey is a 35 year old male here today for follow-up of elevated blood pressure.  Blood pressure was elevated at recent nurse visit for Tdap administration.  It remains elevated today.  He denies any symptoms related to hypertension including chest pain, shortness of breath, palpitations, headache or vision changes.  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  He does not feel like his diet is that high in salt.  He has had some swelling in his legs, especially his right leg.  Denies any pain in this area.  There has been some color change along the right leg.  ROS:  A comprehensive ROS was completed and negative except as noted per HPI  No Known Allergies  History reviewed. No pertinent past medical history.  Past Surgical History:  Procedure Laterality Date   MENISCUS DEBRIDEMENT     TYMPANOSTOMY TUBE PLACEMENT      Social History   Socioeconomic History   Marital status: Married    Spouse name: Not on file   Number of children: Not on file   Years of education: Not on file   Highest education level: Not on file  Occupational History   Not on file  Tobacco Use   Smoking status: Never   Smokeless tobacco: Never  Substance and Sexual Activity   Alcohol use: Yes    Comment: socially   Drug use: Never   Sexual activity: Yes  Other Topics Concern   Not on file  Social History Narrative   Not on file   Social Determinants of Health   Financial Resource Strain: Not on file  Food Insecurity: Not on file  Transportation Needs: Not on file  Physical Activity: Not on file  Stress: Not on file  Social Connections: Not on file    Family History  Problem Relation Age  of Onset   Hypertension Brother    Diabetes Maternal Grandfather    Heart disease Maternal Grandfather    Diabetes Paternal Grandfather     Health Maintenance  Topic Date Due   Hepatitis C Screening  Never done   COVID-19 Vaccine (1) 09/10/2020 (Originally 07/30/1990)   INFLUENZA VACCINE  08/30/2020   TETANUS/TDAP  08/24/2030   HIV Screening  Completed   Pneumococcal Vaccine 70-56 Years old  Aged Out   HPV VACCINES  Aged Out     ----------------------------------------------------------------------------------------------------------------------------------------------------------------------------------------------------------------- Physical Exam BP (!) 156/83   Pulse 68   Temp (!) 97.1 F (36.2 C)   Ht 6\' 2"  (1.88 m)   Wt (!) 420 lb (190.5 kg)   SpO2 99%   BMI 53.92 kg/m   Physical Exam Constitutional:      Appearance: Normal appearance.  HENT:     Head: Normocephalic and atraumatic.  Eyes:     General: No scleral icterus. Cardiovascular:     Rate and Rhythm: Normal rate and regular rhythm.  Pulmonary:     Effort: Pulmonary effort is normal.     Breath sounds: Normal breath sounds.  Musculoskeletal:     Cervical back: Neck supple.     Comments: He has some mild edema bilaterally.  There is hemosiderin deposition of the right lower  extremity.  Skin:    General: Skin is warm and dry.  Neurological:     General: No focal deficit present.     Mental Status: He is alert.  Psychiatric:        Mood and Affect: Mood normal.        Behavior: Behavior normal.    ------------------------------------------------------------------------------------------------------------------------------------------------------------------------------------------------------------------- Assessment and Plan  Essential hypertension Blood pressure elevated on multiple occasions.  We discussed lifestyle change as well as weight loss to help with management of his hypertension.  We will  start losartan 50 mg daily.  Checking baseline labs today.  I will have him follow-up in about 4 weeks to recheck renal function and potassium.  Venous insufficiency Recommend weight loss as well as reduction in sodium intake.  Compression stockings recommended   Meds ordered this encounter  Medications   losartan (COZAAR) 50 MG tablet    Sig: Take 1 tablet (50 mg total) by mouth daily.    Dispense:  90 tablet    Refill:  1    Return in about 4 weeks (around 09/22/2020) for HTN.    This visit occurred during the SARS-CoV-2 public health emergency.  Safety protocols were in place, including screening questions prior to the visit, additional usage of staff PPE, and extensive cleaning of exam room while observing appropriate contact time as indicated for disinfecting solutions.

## 2020-11-25 ENCOUNTER — Other Ambulatory Visit: Payer: Self-pay | Admitting: Family Medicine

## 2020-11-25 NOTE — Telephone Encounter (Signed)
Please call patient to schedule appt per Dr. Ashley Royalty note:  Return in about 4 weeks (around 09/22/2020) for HTN.  Sending 30 day refill.

## 2021-03-09 ENCOUNTER — Other Ambulatory Visit: Payer: Self-pay | Admitting: Family Medicine

## 2021-03-10 MED ORDER — LOSARTAN POTASSIUM 50 MG PO TABS: 50.0000 mg | ORAL_TABLET | Freq: Every day | ORAL | 0 refills | Status: AC

## 2021-04-08 ENCOUNTER — Other Ambulatory Visit: Payer: Self-pay | Admitting: Family Medicine

## 2021-05-03 ENCOUNTER — Other Ambulatory Visit: Payer: Self-pay | Admitting: Family Medicine

## 2021-10-07 ENCOUNTER — Other Ambulatory Visit: Payer: Self-pay

## 2021-10-07 ENCOUNTER — Emergency Department (HOSPITAL_BASED_OUTPATIENT_CLINIC_OR_DEPARTMENT_OTHER): Payer: PRIVATE HEALTH INSURANCE

## 2021-10-07 ENCOUNTER — Emergency Department (HOSPITAL_BASED_OUTPATIENT_CLINIC_OR_DEPARTMENT_OTHER)
Admission: EM | Admit: 2021-10-07 | Discharge: 2021-10-08 | Disposition: A | Discharge status: Home / Self Care | Payer: PRIVATE HEALTH INSURANCE | Attending: Emergency Medicine | Admitting: Emergency Medicine

## 2021-10-07 DIAGNOSIS — Z20822 Contact with and (suspected) exposure to covid-19: Secondary | ICD-10-CM | POA: Insufficient documentation

## 2021-10-07 DIAGNOSIS — R519 Headache, unspecified: Secondary | ICD-10-CM | POA: Diagnosis present

## 2021-10-07 DIAGNOSIS — R110 Nausea: Secondary | ICD-10-CM | POA: Insufficient documentation

## 2021-10-07 LAB — CBC WITH DIFFERENTIAL/PLATELET
Abs Immature Granulocytes: 0.05 10*3/uL (ref 0.00–0.07)
Basophils Absolute: 0 10*3/uL (ref 0.0–0.1)
Basophils Relative: 0 %
Eosinophils Absolute: 0 10*3/uL (ref 0.0–0.5)
Eosinophils Relative: 0 %
HCT: 42.7 % (ref 39.0–52.0)
Hemoglobin: 14.1 g/dL (ref 13.0–17.0)
Immature Granulocytes: 1 %
Lymphocytes Relative: 23 %
Lymphs Abs: 2.4 10*3/uL (ref 0.7–4.0)
MCH: 29.9 pg (ref 26.0–34.0)
MCHC: 33 g/dL (ref 30.0–36.0)
MCV: 90.5 fL (ref 80.0–100.0)
Monocytes Absolute: 1.3 10*3/uL — ABNORMAL HIGH (ref 0.1–1.0)
Monocytes Relative: 13 %
Neutro Abs: 6.4 10*3/uL (ref 1.7–7.7)
Neutrophils Relative %: 63 %
Platelets: 182 10*3/uL (ref 150–400)
RBC: 4.72 MIL/uL (ref 4.22–5.81)
RDW: 13 % (ref 11.5–15.5)
WBC: 10.2 10*3/uL (ref 4.0–10.5)
nRBC: 0 % (ref 0.0–0.2)

## 2021-10-07 LAB — BASIC METABOLIC PANEL
Anion gap: 11 (ref 5–15)
BUN: 12 mg/dL (ref 6–20)
CO2: 25 mmol/L (ref 22–32)
Calcium: 8.8 mg/dL — ABNORMAL LOW (ref 8.9–10.3)
Chloride: 103 mmol/L (ref 98–111)
Creatinine, Ser: 1.15 mg/dL (ref 0.61–1.24)
GFR, Estimated: >60 mL/min (ref >60)
Glucose, Bld: 117 mg/dL — ABNORMAL HIGH (ref 70–99)
Potassium: 3.7 mmol/L (ref 3.5–5.1)
Sodium: 139 mmol/L (ref 135–145)

## 2021-10-07 MED ORDER — DEXTROSE 5 % IV SOLN
500.0000 mg | Freq: Once | INTRAVENOUS | Status: AC
Start: 2021-10-07 — End: 2021-10-08
  Administered 2021-10-07: 500 mg via INTRAVENOUS
  Filled 2021-10-07: qty 5

## 2021-10-07 MED ORDER — PROCHLORPERAZINE EDISYLATE 10 MG/2ML IJ SOLN
10.0000 mg | Freq: Once | INTRAMUSCULAR | Status: AC
  Administered 2021-10-07: 10 mg via INTRAVENOUS
  Filled 2021-10-07: qty 2

## 2021-10-07 MED ORDER — FENTANYL CITRATE PF 50 MCG/ML IJ SOSY
100.0000 ug | PREFILLED_SYRINGE | Freq: Once | INTRAMUSCULAR | Status: AC
  Administered 2021-10-07: 100 ug via INTRAVENOUS
  Filled 2021-10-07: qty 2

## 2021-10-07 MED ORDER — VALPROATE SODIUM 100 MG/ML IV SOLN
INTRAVENOUS | Status: AC
  Filled 2021-10-07: qty 5

## 2021-10-07 NOTE — ED Notes (Signed)
Pt. Refusing to wear pulse ox or BP cuff, educated pt on medical necessity of devices.

## 2021-10-07 NOTE — ED Triage Notes (Signed)
Patient coming to ED for evaluation of headache.  Reports pain started early Thursday morning.  Has had nausea.  No visual changes.  Pt was seen by PCP today and given Toradol, Fioricet, methocarbamol, and nasal spray.  No relief in symptoms.  Sent here for IV medications.

## 2021-10-07 NOTE — ED Provider Notes (Signed)
[facility] EMERGENCY DEPT Provider Note   CSN: [id number] Arrival date & time: 10/07/21  1951     History {Add pertinent medical, surgical, social history, OB history to HPI:1} Chief Complaint  Patient presents with   Headache    Russell Dorsey is a 36 y.o. male.   Headache Patient has headache for about a day and a half now.  Began around 3 in the morning early on Thursday.  Some nausea.  Given Toradol Fioricet Robaxin and nasal spray by PCP today.  Pain is gradually gotten worse since Thursday.  Severe.  Some nausea.  No fevers.  Does feel like there is congestion in his head.  Feels if he could have some chills however.  States he had felt sweaty at times.  No difficulty moving his neck.  No known sick contacts.    No past medical history on file.  Home Medications Prior to Admission medications   Medication Sig Start Date End Date Taking? Authorizing Provider  ibuprofen (ADVIL) 600 MG tablet Take 1 tablet (600 mg total) by mouth every 8 (eight) hours as needed (with food). 11/04/19   Everrett Coombe, DO  losartan (COZAAR) 50 MG tablet Take 1 tablet (50 mg total) by mouth daily. 03/10/21 04/09/21  Everrett Coombe, DO  methocarbamol (ROBAXIN-750) 750 MG tablet Take 1 tablet (750 mg total) by mouth every 8 (eight) hours as needed for muscle spasms. 11/04/19   Everrett Coombe, DO      Allergies    Patient has no known allergies.    Review of Systems   Review of Systems  Constitutional:  Positive for chills.  Neurological:  Positive for headaches.    Physical Exam Updated Vital Signs BP (!) 120/55   Pulse 73   Temp 98.7 F (37.1 C) (Oral)   Resp 18   Ht 6\' 2"  (1.88 m)   Wt (!) 190.5 kg   SpO2 100%   BMI 53.92 kg/m  Physical Exam Vitals and nursing note reviewed.  Eyes:     Pupils: Pupils are equal, round, and reactive to light.  Cardiovascular:     Rate and Rhythm: Regular rhythm.  Pulmonary:     Effort: Pulmonary effort is normal.  Abdominal:      General: Bowel sounds are normal.  Musculoskeletal:     Cervical back: Neck supple.  Skin:    General: Skin is warm.     Capillary Refill: Capillary refill takes less than 2 seconds.  Neurological:     Mental Status: He is alert and oriented to person, place, and time.  Psychiatric:        Mood and Affect: Mood normal.     ED Results / Procedures / Treatments   Labs (all labs ordered are listed, but only abnormal results are displayed) Labs Reviewed  CBC WITH DIFFERENTIAL/PLATELET - Abnormal; Notable for the following components:      Result Value   Monocytes Absolute 1.3 (*)    All other components within normal limits  BASIC METABOLIC PANEL    EKG None  Radiology CT Head Wo Contrast  Result Date: 10/07/2021 CLINICAL DATA:  Headache. EXAM: CT HEAD WITHOUT CONTRAST TECHNIQUE: Contiguous axial images were obtained from the base of the skull through the vertex without intravenous contrast. RADIATION DOSE REDUCTION: This exam was performed according to the departmental dose-optimization program which includes automated exposure control, adjustment of the mA and/or kV according to patient size and/or use of iterative reconstruction technique. COMPARISON:  None Available. FINDINGS: Brain:  The ventricles and sulci are appropriate size for the patient's age. The gray-white matter discrimination is preserved. There is no acute intracranial hemorrhage. No mass effect or midline shift. No extra-axial fluid collection. Vascular: No hyperdense vessel or unexpected calcification. Skull: Normal. Negative for fracture or focal lesion. Sinuses/Orbits: There is a 2 cm right maxillary sinus retention cyst or polyp. The remainder of the visualized paranasal sinuses and mastoid air cells are clear. Other: None IMPRESSION: No acute intracranial pathology. Electronically Signed   By: Elgie Collard M.D.   On: 10/07/2021 22:55    Procedures Procedures  {Document cardiac monitor, telemetry assessment  procedure when appropriate:1}  Medications Ordered in ED Medications  prochlorperazine (COMPAZINE) injection 10 mg (10 mg Intravenous Given 10/07/21 2241)    ED Course/ Medical Decision Making/ A&P                           Medical Decision Making Amount and/or Complexity of Data Reviewed Labs: ordered. Radiology: ordered.  Risk Prescription drug management.   Patient with headache.  Severe.  Gradual onset.  Since Thursday.  No fevers but states he had felt some chills.  Afebrile here.  Saw PCP without relief.  Head CT done and reassuring.  No clear infection.  Meningitis felt less likely.  Intracranial hemorrhage felt less likely with the gradual onset of the pain.  Nonfocal exam.  Will treat symptomatically and will get COVID testing with the chills.  {Document critical care time when appropriate:1} {Document review of labs and clinical decision tools ie heart score, Chads2Vasc2 etc:1}  {Document your independent review of radiology images, and any outside records:1} {Document your discussion with family members, caretakers, and with consultants:1} {Document social determinants of health affecting pt's care:1} {Document your decision making why or why not admission, treatments were needed:1} Final Clinical Impression(s) / ED Diagnoses Final diagnoses:  None    Rx / DC Orders ED Discharge Orders     None

## 2021-10-08 LAB — SARS CORONAVIRUS 2 BY RT PCR: SARS Coronavirus 2 by RT PCR: NEGATIVE

## 2021-10-08 NOTE — Discharge Instructions (Signed)
Begin taking Tylenol 1000 mg rotated with ibuprofen 600 mg every 4 hours as needed for pain.  Avoid alcohol and caffeine.  Return to the emergency department if your symptoms significantly worsen or change.

## 2021-10-08 NOTE — ED Provider Notes (Signed)
  Physical Exam  BP (!) 122/59   Pulse 73   Temp 98.8 F (37.1 C) (Oral)   Resp 18   Ht 6\' 2"  (1.88 m)   Wt (!) 190.5 kg   SpO2 94%   BMI 53.92 kg/m   Physical Exam Vitals and nursing note reviewed.  Constitutional:      General: He is not in acute distress.    Appearance: He is well-developed. He is not ill-appearing.  HENT:     Head: Normocephalic and atraumatic.  Pulmonary:     Effort: Pulmonary effort is normal.  Neurological:     Mental Status: He is alert.     Cranial Nerves: No dysarthria.     Procedures  Procedures  ED Course / MDM  Care assumed from Dr. at shift change.  Patient is awaiting response to medications.  He has apparently been experiencing headache for the past several days.  He has received Compazine and admits to feeling significantly improved.  At this point, patient seems appropriate for discharge.  He is neurologically intact and head CT is unremarkable.  Patient to be discharged with as needed return.       Rubin Payor, MD 10/08/21 [phone]

## 2022-01-20 IMAGING — CT CT ANGIO CHEST
2 of 8 series · 18 of 37 positions shown · IV contrast (omnipaque)
Comparison: None.

CLINICAL DATA: COVID, shortness of breath

EXAM:
CT ANGIOGRAPHY CHEST WITH CONTRAST
TECHNIQUE: Multidetector CT imaging of the chest was performed using the
standard protocol during bolus administration of intravenous
contrast. Multiplanar CT image reconstructions and MIPs were
obtained to evaluate the vascular anatomy.
CONTRAST:  100mL OMNIPAQUE IOHEXOL 350 MG/ML SOLN

[Series 6: pe coronal mpr · coronal · 0.69mm/px · 3 of 191 slices shown]
[im 39/191  mediastinal]
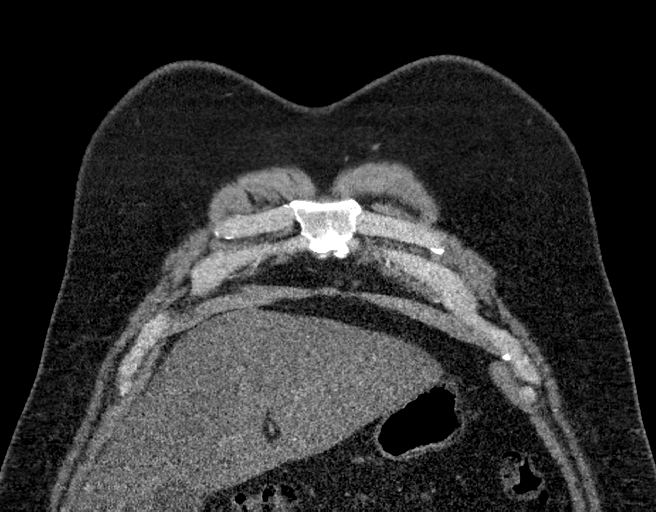
[im 77/191  mediastinal]
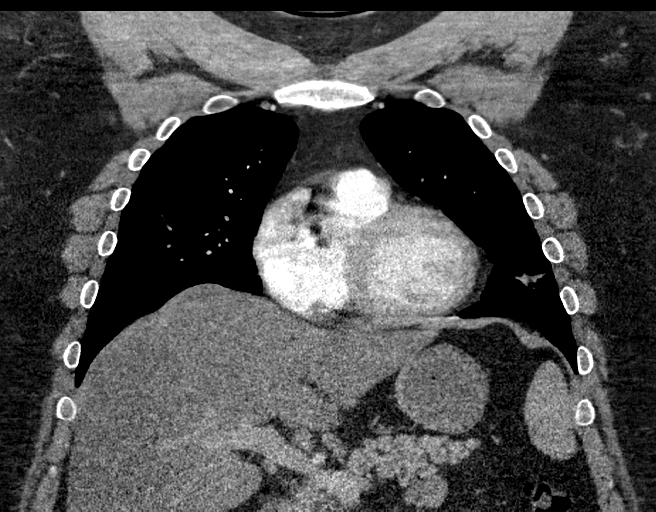
[im 115/191  mediastinal]
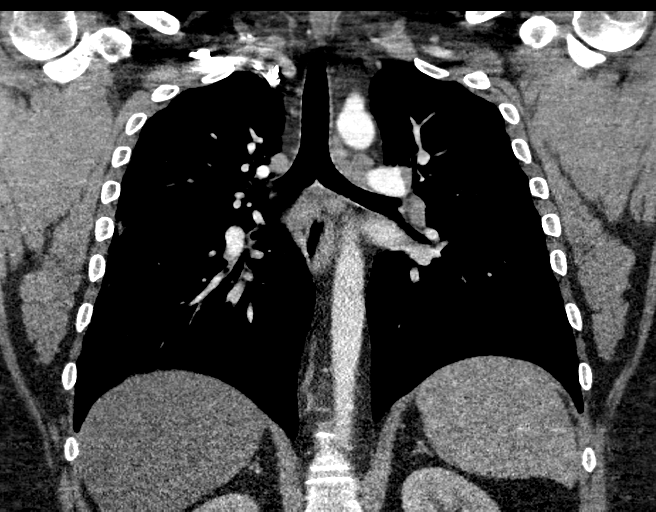

[Series 10: pe thins · axial · 0.93mm/px · z∈[-132,+175]mm · 15 of 345 slices shown]
[im 19/345  lung]
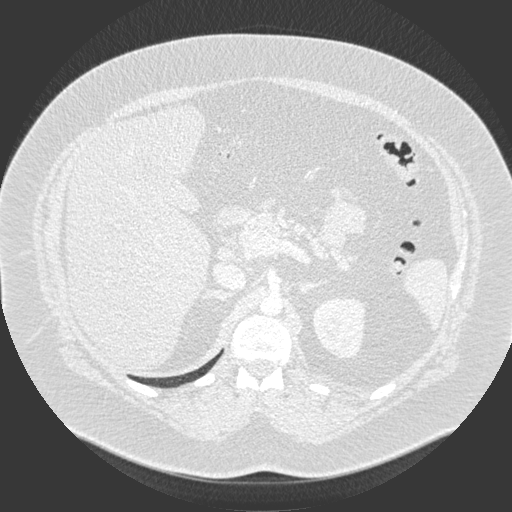
[im 37/345  mediastinal]
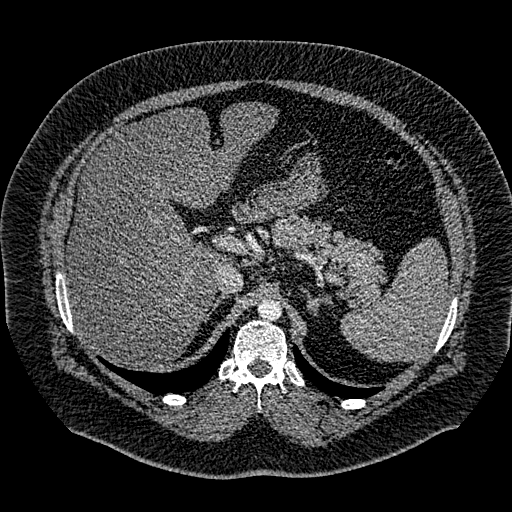
[im 73/345  lung]
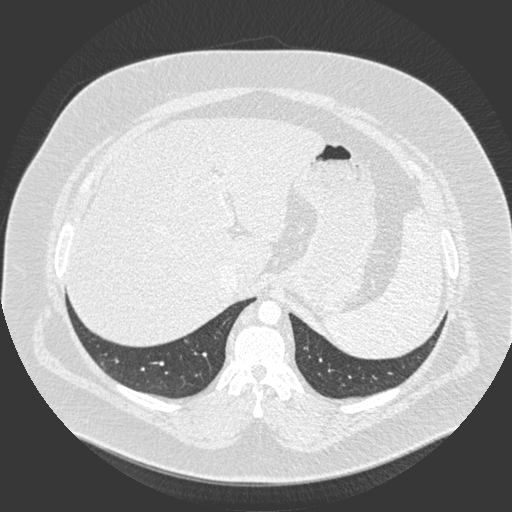
[im 91/345  mediastinal]
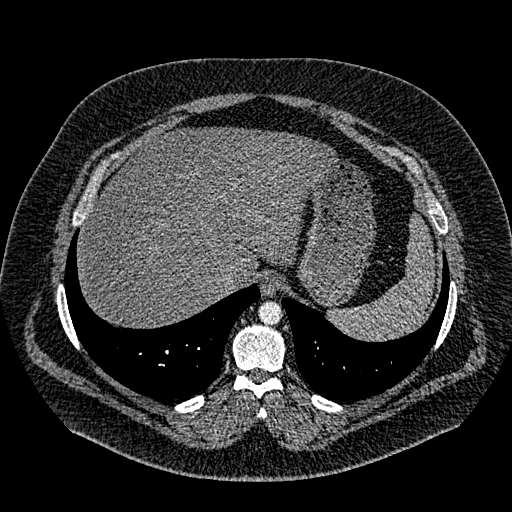
[im 109/345  lung]
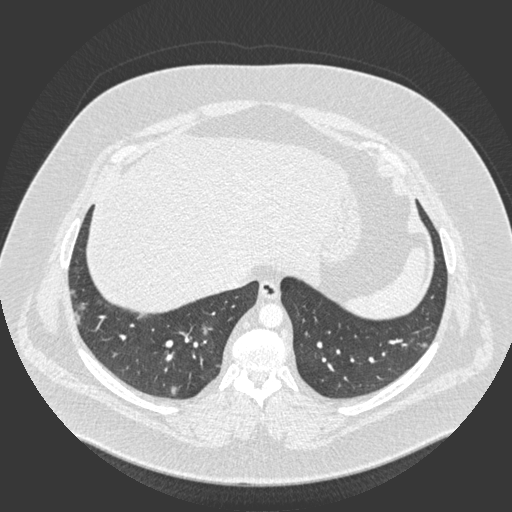
[im 127/345  mediastinal]
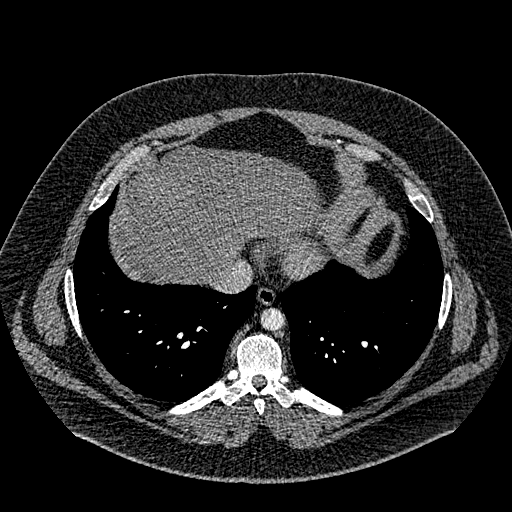
[im 145/345  lung]
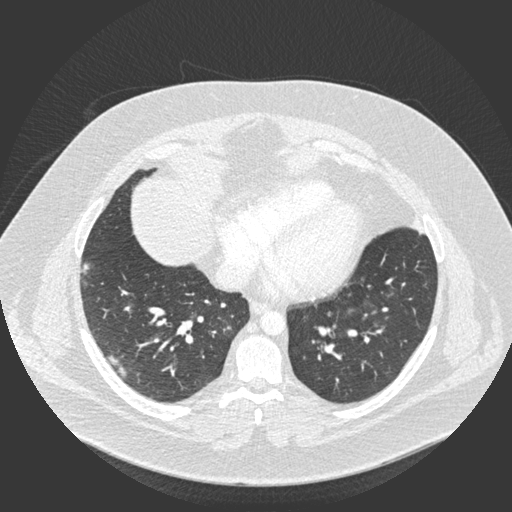
[im 182/345  mediastinal]
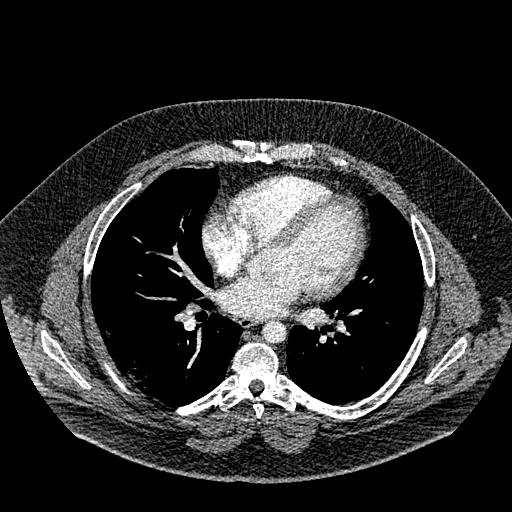
[im 200/345  lung]
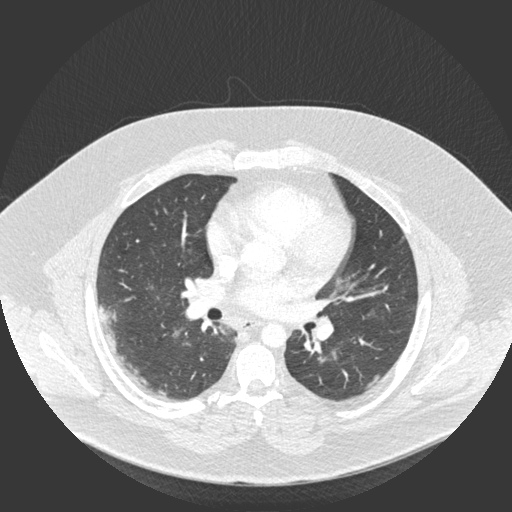
[im 218/345  mediastinal]
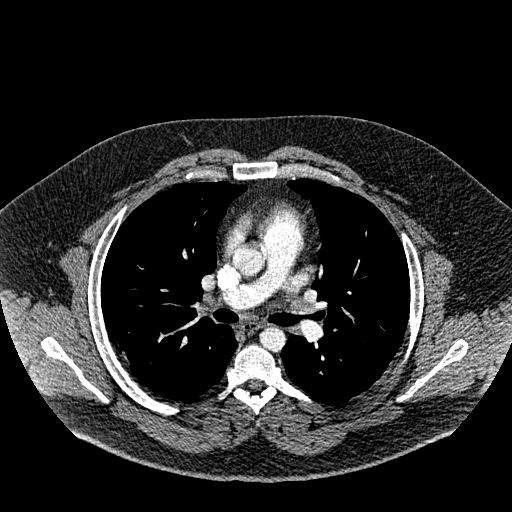
[im 236/345  lung]
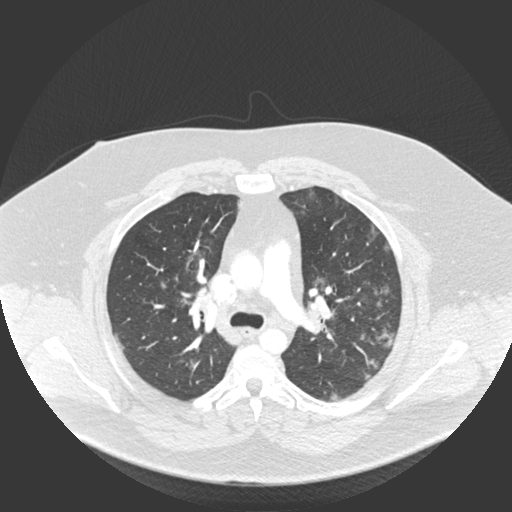
[im 254/345  mediastinal]
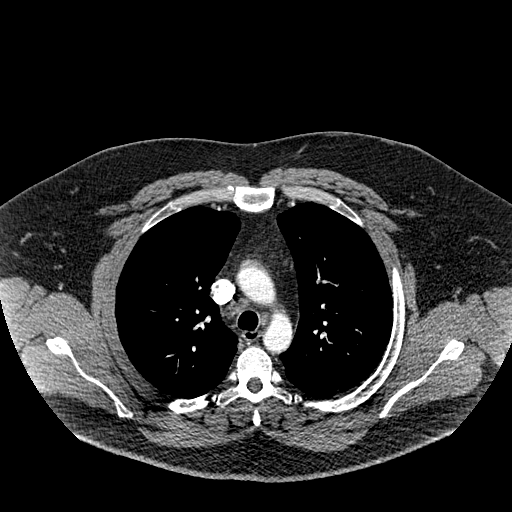
[im 290/345  lung]
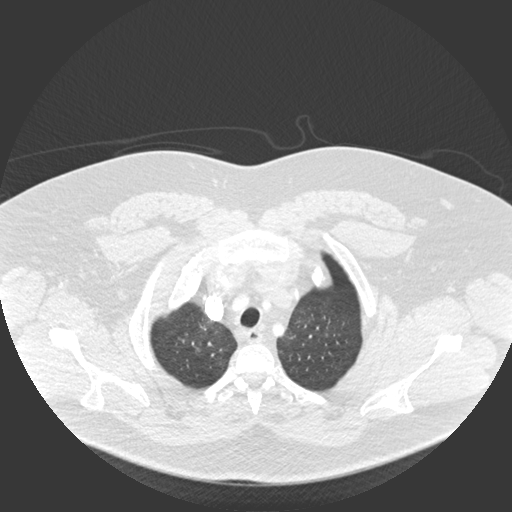
[im 308/345  mediastinal]
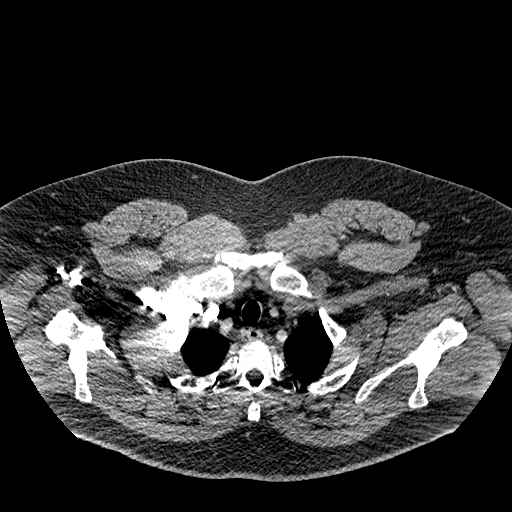
[im 326/345  lung]
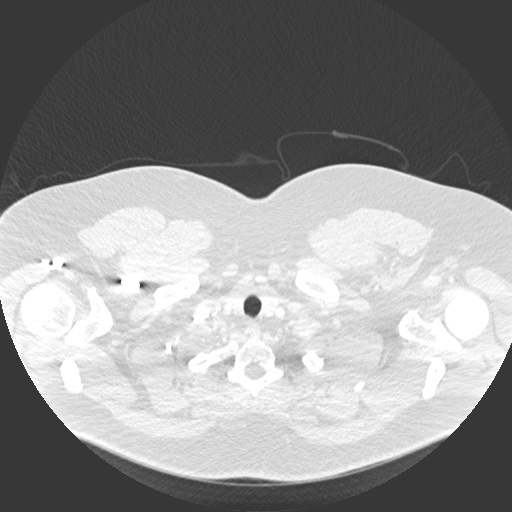

[18 of 37 positions shown; findings below may reference images not displayed]

FINDINGS: Cardiovascular: There is slightly suboptimal opacification of the
main pulmonary artery, however no central or segmental pulmonary
embolism is seen. The heart is normal in size. No pericardial
effusion or thickening. No evidence right heart strain. There is
normal three-vessel brachiocephalic anatomy without proximal
stenosis. The thoracic aorta is normal in appearance.

Mediastinum/Nodes: Scattered small paratracheal subcarinal lymph
nodes are noted. Thyroid gland, trachea, and esophagus demonstrate
no significant findings.

Lungs/Pleura: Multifocal patchy airspace opacities are seen
predominantly within the periphery perihilar regions right greater
than left. No pneumothorax pleural effusion.

Upper Abdomen: No acute abnormalities present in the visualized
portions of the upper abdomen. There is diffuse low density seen
throughout the liver parenchyma.

Musculoskeletal: No chest wall abnormality. No acute or significant
osseous findings.

Review of the MIP images confirms the above findings.
IMPRESSION: Slightly suboptimal opacification of the main pulmonary artery, no
central or segmental pulmonary embolism

Multifocal airspace opacities right greater than left, consistent
with COVID pneumonia.
